# Patient Record
Sex: Male | Born: 1990 | Race: White | Hispanic: No | Marital: Married | Smoking: Never smoker
Health system: Southern US, Community
[De-identification: ages and names within clinical notes are randomized; demographics above are authoritative.]

## PROBLEM LIST (undated history)

## (undated) DIAGNOSIS — Z87442 Personal history of urinary calculi: Secondary | ICD-10-CM

## (undated) HISTORY — PX: OTHER SURGICAL HISTORY: SHX169

---
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
## 2011-05-14 ENCOUNTER — Emergency Department (HOSPITAL_COMMUNITY): Payer: Medicaid Other

## 2011-05-14 ENCOUNTER — Emergency Department (HOSPITAL_COMMUNITY)
Admission: EM | Admit: 2011-05-14 | Discharge: 2011-05-14 | Disposition: A | Discharge status: Home / Self Care | Payer: Medicaid Other | Attending: Emergency Medicine | Admitting: Emergency Medicine

## 2011-05-14 ENCOUNTER — Encounter (HOSPITAL_COMMUNITY): Payer: Self-pay

## 2011-05-14 DIAGNOSIS — R109 Unspecified abdominal pain: Secondary | ICD-10-CM | POA: Insufficient documentation

## 2011-05-14 DIAGNOSIS — R112 Nausea with vomiting, unspecified: Secondary | ICD-10-CM

## 2011-05-14 DIAGNOSIS — R197 Diarrhea, unspecified: Secondary | ICD-10-CM | POA: Insufficient documentation

## 2011-05-14 LAB — LIPASE, BLOOD: Lipase: 16 U/L (ref 11–59)

## 2011-05-14 LAB — DIFFERENTIAL
Basophils Absolute: 0 10*3/uL (ref 0.0–0.1)
Eosinophils Absolute: 0 10*3/uL (ref 0.0–0.7)
Lymphs Abs: 1.6 10*3/uL (ref 0.7–4.0)

## 2011-05-14 LAB — CBC
HCT: 51 % (ref 39.0–52.0)
Hemoglobin: 18 g/dL — ABNORMAL HIGH (ref 13.0–17.0)
MCHC: 35.3 g/dL (ref 30.0–36.0)
MCV: 89.2 fL (ref 78.0–100.0)
Platelets: 289 10*3/uL (ref 150–400)
RBC: 5.72 MIL/uL (ref 4.22–5.81)

## 2011-05-14 LAB — COMPREHENSIVE METABOLIC PANEL
AST: 20 U/L (ref 0–37)
Alkaline Phosphatase: 100 U/L (ref 39–117)
BUN: 20 mg/dL (ref 6–23)
CO2: 25 mEq/L (ref 19–32)
Calcium: 11.1 mg/dL — ABNORMAL HIGH (ref 8.4–10.5)
Chloride: 99 mEq/L (ref 96–112)
Creatinine, Ser: 0.96 mg/dL (ref 0.50–1.35)
Glucose, Bld: 162 mg/dL — ABNORMAL HIGH (ref 70–99)

## 2011-05-14 MED ORDER — ONDANSETRON HCL 4 MG PO TABS: 4.0000 mg | ORAL_TABLET | Freq: Four times a day (QID) | ORAL | Status: AC | PRN

## 2011-05-14 MED ORDER — SODIUM CHLORIDE 0.9 % IV BOLUS (SEPSIS)
1000.0000 mL | Freq: Once | INTRAVENOUS | Status: AC
  Administered 2011-05-14: 1000 mL via INTRAVENOUS

## 2011-05-14 MED ORDER — ONDANSETRON HCL 4 MG/2ML IJ SOLN
4.0000 mg | Freq: Once | INTRAMUSCULAR | Status: AC
  Administered 2011-05-14: 4 mg via INTRAVENOUS
  Filled 2011-05-14: qty 2

## 2011-05-14 MED ORDER — IOHEXOL 300 MG/ML  SOLN
100.0000 mL | Freq: Once | INTRAMUSCULAR | Status: AC | PRN
  Administered 2011-05-14: 100 mL via INTRAVENOUS

## 2011-05-14 MED ORDER — ONDANSETRON HCL 4 MG/2ML IJ SOLN
INTRAMUSCULAR | Status: AC
  Filled 2011-05-14: qty 2

## 2011-05-14 MED ORDER — FAMOTIDINE IN NACL 20-0.9 MG/50ML-% IV SOLN
20.0000 mg | Freq: Once | INTRAVENOUS | Status: AC
  Administered 2011-05-14: 20 mg via INTRAVENOUS
  Filled 2011-05-14: qty 50

## 2011-05-14 MED ORDER — SODIUM CHLORIDE 0.9 % IV SOLN
INTRAVENOUS | Status: DC
  Administered 2011-05-14: 14:00:00 via INTRAVENOUS

## 2011-05-14 MED ORDER — ONDANSETRON HCL 4 MG/2ML IJ SOLN
4.0000 mg | Freq: Once | INTRAMUSCULAR | Status: AC
  Administered 2011-05-14: 4 mg via INTRAVENOUS

## 2011-05-14 MED ORDER — ONDANSETRON HCL 4 MG/2ML IJ SOLN: 4.0000 mg | INTRAMUSCULAR | Status: DC | PRN

## 2011-05-14 MED ORDER — ONDANSETRON HCL 4 MG/2ML IJ SOLN
4.0000 mg | INTRAMUSCULAR | Status: AC | PRN
  Administered 2011-05-14 (×2): 4 mg via INTRAVENOUS
  Filled 2011-05-14 (×2): qty 2

## 2011-05-14 MED ORDER — TRAMADOL HCL 50 MG PO TABS: 50.0000 mg | ORAL_TABLET | Freq: Four times a day (QID) | ORAL | Status: AC | PRN

## 2011-05-14 MED ORDER — FENTANYL CITRATE 0.05 MG/ML IJ SOLN
50.0000 ug | Freq: Once | INTRAMUSCULAR | Status: AC
  Administered 2011-05-14: 50 ug via INTRAVENOUS
  Filled 2011-05-14: qty 2

## 2011-05-14 NOTE — ED Notes (Signed)
MD at bedside. 

## 2011-05-14 NOTE — ED Provider Notes (Signed)
History     CSN: [id number]  Arrival date & time 05/14/11  1154   First MD Initiated Contact with Patient 05/14/11 1311      Chief Complaint  Patient presents with  . Emesis  . Abdominal Pain    HPI Pt was seen at 1320.  Per pt, c/o gradual onset and persistence of constant upper abd "pain" that began this morning approx 0500 PTA.  Has been associated with multiple intermittent episodes of N/V/D.  Describes the abd pain as "cramping."  Denies fevers, no CP/SOB, no back pain, no black or blood in stools or emesis.     History reviewed. No pertinent past medical history.  History reviewed. No pertinent past surgical history.  History  Substance Use Topics  . Smoking status: Never Smoker   . Smokeless tobacco: Not on file  . Alcohol Use: No    Review of Systems ROS: Statement: All systems negative except as marked or noted in the HPI; Constitutional: Negative for fever and chills. ; ; Eyes: Negative for eye pain, redness and discharge. ; ; ENMT: Negative for ear pain, hoarseness, nasal congestion, sinus pressure and sore throat. ; ; Cardiovascular: Negative for chest pain, palpitations, diaphoresis, dyspnea and peripheral edema. ; ; Respiratory: Negative for cough, wheezing and stridor. ; ; Gastrointestinal: +abd pain, N/V/D. Negative for blood in stool, hematemesis, jaundice and rectal bleeding. . ; ; Genitourinary: Negative for dysuria, flank pain and hematuria. ; ; Musculoskeletal: Negative for back pain and neck pain. Negative for swelling and trauma.; ; Skin: Negative for pruritus, rash, abrasions, blisters, bruising and skin lesion.; ; Neuro: Negative for headache, lightheadedness and neck stiffness. Negative for weakness, altered level of consciousness , altered mental status, extremity weakness, paresthesias, involuntary movement, seizure and syncope.     Allergies  Amoxicillin  Home Medications  No current outpatient prescriptions on file.  BP 115/78  Pulse 99  Temp(Src)  97.2 F (36.2 C) (Oral)  Resp 18  Ht 6\' 1"  (1.854 m)  Wt 145 lb (65.772 kg)  BMI 19.13 kg/m2  SpO2 100%  Physical Exam 1325: Physical examination:  Nursing notes reviewed; Vital signs and O2 SAT reviewed;  Constitutional: Well developed, Well nourished, Well hydrated, In no acute distress; Head:  Normocephalic, atraumatic; Eyes: EOMI, PERRL, No scleral icterus; ENMT: Mouth and pharynx normal, Mucous membranes moist; Neck: Supple, Full range of motion, No lymphadenopathy; Cardiovascular: Regular rate and rhythm, No murmur, rub, or gallop; Respiratory: Breath sounds clear & equal bilaterally, No rales, rhonchi, wheezes, or rub, Normal respiratory effort/excursion; Chest: Nontender, Movement normal; Abdomen: Soft, +mild diffuse tenderness to palp, no rebound or guarding. Nondistended, Normal bowel sounds; Extremities: Pulses normal, No tenderness, No edema, No calf edema or asymmetry.; Neuro: AA&Ox3, Major CN grossly intact.  No gross focal motor or sensory deficits in extremities.; Skin: Color normal, Warm, Dry, no rash.    ED Course  Procedures    MDM  MDM Reviewed: nursing note and vitals Interpretation: labs and CT scan     Results for orders placed during the hospital encounter of 05/14/11  CBC      Component Value Range   WBC 24.7 (*) 4.0 - 10.5 (K/uL)   RBC 5.72  4.22 - 5.81 (MIL/uL)   Hemoglobin 18.0 (*) 13.0 - 17.0 (g/dL)   HCT 78.2  95.6 - 21.3 (%)   MCV 89.2  78.0 - 100.0 (fL)   MCH 31.5  26.0 - 34.0 (pg)   MCHC 35.3  30.0 - 36.0 (g/dL)  RDW 13.4  11.5 - 15.5 (%)   Platelets 289  150 - 400 (K/uL)  DIFFERENTIAL      Component Value Range   Neutrophils Relative 83 (*) 43 - 77 (%)   Neutro Abs 20.5 (*) 1.7 - 7.7 (K/uL)   Lymphocytes Relative 6 (*) 12 - 46 (%)   Lymphs Abs 1.6  0.7 - 4.0 (K/uL)   Monocytes Relative 10  3 - 12 (%)   Monocytes Absolute 2.6 (*) 0.1 - 1.0 (K/uL)   Eosinophils Relative 0  0 - 5 (%)   Eosinophils Absolute 0.0  0.0 - 0.7 (K/uL)   Basophils  Relative 0  0 - 1 (%)   Basophils Absolute 0.0  0.0 - 0.1 (K/uL)  COMPREHENSIVE METABOLIC PANEL      Component Value Range   Sodium 141  135 - 145 (mEq/L)   Potassium 3.7  3.5 - 5.1 (mEq/L)   Chloride 99  96 - 112 (mEq/L)   CO2 25  19 - 32 (mEq/L)   Glucose, Bld 162 (*) 70 - 99 (mg/dL)   BUN 20  6 - 23 (mg/dL)   Creatinine, Ser 1.19  0.50 - 1.35 (mg/dL)   Calcium 14.7 (*) 8.4 - 10.5 (mg/dL)   Total Protein 8.6 (*) 6.0 - 8.3 (g/dL)   Albumin 5.4 (*) 3.5 - 5.2 (g/dL)   AST 20  0 - 37 (U/L)   ALT 13  0 - 53 (U/L)   Alkaline Phosphatase 100  39 - 117 (U/L)   Total Bilirubin 0.6  0.3 - 1.2 (mg/dL)   GFR calc non Af Amer >90  >90 (mL/min)   GFR calc Af Amer >90  >90 (mL/min)  LIPASE, BLOOD      Component Value Range   Lipase 16  11 - 59 (U/L)    Ct Abdomen Pelvis W Contrast 05/14/2011  *RADIOLOGY REPORT*  Clinical Data: Abdominal pain with nausea and vomiting.  CT ABDOMEN AND PELVIS WITH CONTRAST  Technique:  Multidetector CT imaging of the abdomen and pelvis was performed following the standard protocol during bolus administration of intravenous contrast.  Contrast: OMNIPAQUE IOHEXOL 300 MG/ML  SOLN  Comparison: Radiography 01/30/2010  Findings: Lung bases are clear.  No pleural or pericardial fluid. The liver has a normal appearance without focal lesions or biliary ductal dilatation.  No calcified gallstones.  The spleen is normal. The pancreas is normal.  The adrenal glands are normal.  The kidneys are normal.  The aorta and IVC are normal.  No retroperitoneal mass or adenopathy.  No free intraperitoneal fluid or air.  The appendix is normal.  No other bowel pathology is evident. Bladder, prostate gland and seminal vesicles are unremarkable.  No significant bony finding.  IMPRESSION: Normal CT scan of the abdomen and pelvis.  Original Report Authenticated By: Thomasenia Sales, M.D.     5:39 PM:   Pt has tol PO after IVF and IV zofran.   WBC likely due to demargination due to N/V, as CT  scan of A/P and other labs unremarkable, VS are stable without fever.  Wants to go home now.  Dx testing d/w pt and family.  Questions answered.  Verb understanding, agreeable to d/c home with outpt f/u.         Laray Anger, DO 05/16/11 2329

## 2011-05-14 NOTE — ED Notes (Signed)
Patient actively vomiting, c/o abd pain.

## 2011-05-14 NOTE — Discharge Instructions (Signed)
RESOURCE GUIDE  Dental Problems  Patients with Medicaid: [facility] [phone] [address]                                           [phone] Phone:  [phone]                                                  Phone:  [phone]  If unable to pay or uninsured, contact:  [facility] or [facility] to become qualified for the adult dental clinic.  Chronic Pain Problems Contact [facility]  [phone] Patients need to be referred by their primary care doctor.  Insufficient Money for Medicine Contact United Way:  call "211" or [facility] 847-520-0509.  No Primary Care Doctor Call [facility]  [phone] Other agencies that provide inexpensive medical care    [facility]  [phone]    [facility]  [phone]    [facility]  [phone]    [facility]  [phone]    [facility]  [phone]    [facility]  [phone]  [facility]  [phone] [facility]  [phone] [facility]   [phone] (emergency services [phone])  Substance Abuse Resources [facility]  [phone] [facility] [phone] [facility] [place] [phone] Floydene Flock [phone] Residential & Outpatient Substance Abuse Program  [phone]  Abuse/Neglect [facility] Child Abuse Hotline (253)132-4499 [facility] Child Abuse Hotline (705)131-7672 (After Hours)  Emergency Shelter [facility] [phone]  Maternity Homes [facility] at the [place] [facility] [place] [phone] [facility] Alert Services [phone]  MRSA Hotline #:   (781)463-4733    [facility] Resources  [facility]     [employer]                          [facility] Dept. [address] [place]                       [address]      [address] [place] [address]  Blondell Reveal Phone:  [phone]                                   Phone:  [phone]                 Phone:  [phone]  [facility] Phone:  [phone]  [facility] Child Abuse Hotline 731-199-4959 [phone] (After Hours)    Take the prescription as directed.  Increase your fluid intake (ie:  Gatoraide) for the next few days, as discussed.  Eat a bland diet and advance to your regular diet slowly as you can tolerate it.   Avoid full strength juices, as well as milk and milk products until your diarrhea has resolved.   Call your regular medical doctor on Monday to schedule a follow up appointment within the next week.  Return to the Emergency Department immediately if not improving (or even worsening) despite taking the medicines as prescribed, any black or bloody stool or vomit, if you develop a fever greater than "101," or for any other concerns.

## 2011-05-14 NOTE — ED Notes (Signed)
Pt presents with abdominal pain and vomiting since 0500.

## 2011-05-14 NOTE — ED Notes (Signed)
In to discharge pt. Pt states he has to have pain meds to take at home or he will be right back up here. edp aware

## 2012-06-30 ENCOUNTER — Emergency Department (HOSPITAL_COMMUNITY)
Admission: EM | Admit: 2012-06-30 | Discharge: 2012-06-30 | Disposition: A | Discharge status: Home / Self Care | Payer: Medicaid Other | Attending: Emergency Medicine | Admitting: Emergency Medicine

## 2012-06-30 ENCOUNTER — Encounter (HOSPITAL_COMMUNITY): Payer: Self-pay | Admitting: *Deleted

## 2012-06-30 ENCOUNTER — Ambulatory Visit: Payer: Self-pay | Admitting: Physician Assistant

## 2012-06-30 DIAGNOSIS — Z880 Allergy status to penicillin: Secondary | ICD-10-CM | POA: Insufficient documentation

## 2012-06-30 DIAGNOSIS — R1110 Vomiting, unspecified: Secondary | ICD-10-CM | POA: Insufficient documentation

## 2012-06-30 DIAGNOSIS — R1032 Left lower quadrant pain: Secondary | ICD-10-CM | POA: Insufficient documentation

## 2012-06-30 DIAGNOSIS — R109 Unspecified abdominal pain: Secondary | ICD-10-CM

## 2012-06-30 LAB — COMPREHENSIVE METABOLIC PANEL
Albumin: 4.3 g/dL (ref 3.5–5.2)
BUN: 10 mg/dL (ref 6–23)
Calcium: 9.7 mg/dL (ref 8.4–10.5)
Chloride: 99 mEq/L (ref 96–112)
GFR calc Af Amer: >90 mL/min (ref >90)
Glucose, Bld: 99 mg/dL (ref 70–99)

## 2012-06-30 LAB — CBC WITH DIFFERENTIAL/PLATELET
Basophils Relative: 0 % (ref 0–1)
Eosinophils Absolute: 0.2 10*3/uL (ref 0.0–0.7)
HCT: 44.7 % (ref 39.0–52.0)
Hemoglobin: 15.7 g/dL (ref 13.0–17.0)
Lymphocytes Relative: 31 % (ref 12–46)
Monocytes Relative: 10 % (ref 3–12)
Neutro Abs: 3.2 10*3/uL (ref 1.7–7.7)
Neutrophils Relative %: 56 % (ref 43–77)
Platelets: 184 10*3/uL (ref 150–400)
WBC: 5.8 10*3/uL (ref 4.0–10.5)

## 2012-06-30 LAB — URINALYSIS, ROUTINE W REFLEX MICROSCOPIC
Bilirubin Urine: NEGATIVE
Hgb urine dipstick: NEGATIVE
Protein, ur: NEGATIVE mg/dL
Specific Gravity, Urine: 1.01 (ref 1.005–1.030)
pH: 6.5 (ref 5.0–8.0)

## 2012-06-30 LAB — LIPASE, BLOOD: Lipase: 17 U/L (ref 11–59)

## 2012-06-30 MED ORDER — HYDROCODONE-ACETAMINOPHEN 5-325 MG PO TABS
1.0000 | ORAL_TABLET | Freq: Once | ORAL | Status: AC
  Administered 2012-06-30: 1 via ORAL
  Filled 2012-06-30: qty 1

## 2012-06-30 MED ORDER — DICYCLOMINE HCL 20 MG PO TABS: ORAL_TABLET | ORAL | Status: DC

## 2012-06-30 MED ORDER — OXYCODONE-ACETAMINOPHEN 5-325 MG PO TABS: 1.0000 | ORAL_TABLET | Freq: Four times a day (QID) | ORAL | Status: DC | PRN

## 2012-06-30 NOTE — ED Notes (Signed)
Abdominal pain x 1 year. Pain became worse a month ago. Also states right hand fx and "Morehead didn't wrap it right". Does not know who follow up orthopedist is to follow up with.

## 2012-06-30 NOTE — ED Provider Notes (Signed)
History  This chart was scribed for Tony Lennert, MD by Ardelia Mems, ED Scribe. This patient was seen in room [facility]10 and the patient's care was started at 12:18 PM.   CSN: [id number]  Arrival date & time 06/30/12  1124      Chief Complaint  Patient presents with  . Hand Pain  . Abdominal Pain     Patient is a 22 y.o. male presenting with hand pain and abdominal pain. The history is provided by the patient. No language interpreter was used.  Hand Pain This is a new problem. The problem occurs constantly. The problem has not changed since onset.Associated symptoms include abdominal pain. Pertinent negatives include no chest pain and no headaches. Exacerbated by: Placement of splint. Nothing relieves the symptoms.  Abdominal Pain This is a chronic problem. Episode onset: 1 year ago. The problem has not changed since onset.Associated symptoms include abdominal pain. Pertinent negatives include no chest pain and no headaches. Nothing aggravates the symptoms.    HPI Comments: Tony Turner is a 22 y.o. male who presents to the Emergency Department complaining of sharp abdominal pain of 1 year duration that has gradually worsened over the last several months. There is associated emesis. Last episode of emesis was 2 days ago. Pt also states that his right hand is fractured and that "Morehead" didn't splint it correctly. Pt denies fever, chill, diarrhea, recent injury or any other symptoms.   History reviewed. No pertinent past medical history.  History reviewed. No pertinent past surgical history.  No family history on file.  History  Substance Use Topics  . Smoking status: Never Smoker   . Smokeless tobacco: Not on file  . Alcohol Use: No      Review of Systems  Constitutional: Negative for appetite change and fatigue.  HENT: Negative for congestion, sinus pressure and ear discharge.   Eyes: Negative for discharge.  Respiratory: Negative for cough.    Cardiovascular: Negative for chest pain.  Gastrointestinal: Positive for vomiting and abdominal pain. Negative for diarrhea.  Genitourinary: Negative for frequency and hematuria.  Musculoskeletal: Negative for back pain.  Skin: Negative for rash.  Neurological: Negative for seizures and headaches.  Psychiatric/Behavioral: Negative for hallucinations.    Allergies  Amoxicillin  Home Medications  No current outpatient prescriptions on file.  Triage Vitals: BP 113/85  Pulse 83  Temp(Src) 97.6 F (36.4 C) (Oral)  Resp 16  Ht 6\' 1"  (1.854 m)  Wt 155 lb (70.308 kg)  BMI 20.45 kg/m2  SpO2 100%  Physical Exam  Constitutional: He is oriented to person, place, and time. He appears well-developed.  HENT:  Head: Normocephalic.  Eyes: Conjunctivae and EOM are normal. No scleral icterus.  Neck: Neck supple. No thyromegaly present.  Cardiovascular: Normal rate and regular rhythm.  Exam reveals no gallop and no friction rub.   No murmur heard. Pulmonary/Chest: No stridor. He has no wheezes. He has no rales. He exhibits no tenderness.  Abdominal: He exhibits no distension. There is tenderness. There is no rebound.  Mild LLQ tenderness.  Musculoskeletal: Normal range of motion. He exhibits no edema.  Splint on right hand for boxer's fracture.  Lymphadenopathy:    He has no cervical adenopathy.  Neurological: He is oriented to person, place, and time. Coordination normal.  Skin: No rash noted. No erythema.  Psychiatric: He has a normal mood and affect. His behavior is normal.    ED Course  Procedures (including critical care time)  DIAGNOSTIC STUDIES: Oxygen Saturation  is 100% on RA, normal by my interpretation.    COORDINATION OF CARE: 12:21 PM- Pt advised of plan for treatment and pt agrees.  Medications  HYDROcodone-acetaminophen (NORCO/VICODIN) 5-325 MG per tablet 1 tablet (1 tablet Oral Given 06/30/12 1303)      Labs Reviewed  CBC WITH DIFFERENTIAL  COMPREHENSIVE  METABOLIC PANEL  LIPASE, BLOOD  URINALYSIS, ROUTINE W REFLEX MICROSCOPIC   No results found.   No diagnosis found.    MDM         The chart was scribed for me under my direct supervision.  I personally performed the history, physical, and medical decision making and all procedures in the evaluation of this patient.Tony Lennert, MD 06/30/12 [phone]

## 2012-07-06 ENCOUNTER — Ambulatory Visit (INDEPENDENT_AMBULATORY_CARE_PROVIDER_SITE_OTHER): Payer: Medicaid Other | Admitting: Physician Assistant

## 2012-07-06 ENCOUNTER — Other Ambulatory Visit: Payer: Self-pay

## 2012-07-06 ENCOUNTER — Encounter: Payer: Self-pay | Admitting: Physician Assistant

## 2012-07-06 VITALS — BP 135/83 | HR 65 | Temp 97.9°F | Ht 73.0 in | Wt 165.0 lb

## 2012-07-06 DIAGNOSIS — R1032 Left lower quadrant pain: Secondary | ICD-10-CM

## 2012-07-06 DIAGNOSIS — S62309A Unspecified fracture of unspecified metacarpal bone, initial encounter for closed fracture: Secondary | ICD-10-CM

## 2012-07-06 NOTE — Progress Notes (Signed)
Subjective:     Patient ID: Tony Turner, male   DOB: 09/15/1990, 22 y.o.   MRN: [id number]  HPI Pt recently seen at [facility] ER for 5th metacarpal fx Told to f/u here for referral Pt then went to [facility] for the same several days later He was also seen due to abd pain x 1 yr Informed all labs nl by the ER Sx are intermit but occur everyday Admits to constipation on occasion Denies N/V or weight loss  Review of Systems  All other systems reviewed and are negative.       Objective:   Physical Exam  Nursing note and vitals reviewed.  R arm in sling with arm in splint Abd- soft, + TTP LLQ, no masses/HSM No CVAT    Assessment:     1. Metacarpal bone fracture, closed, initial encounter   2. LLQ abdominal pain        Plan:  Refer to Ortho in [place] per pt request ? Constipation for intermit abd pain Milk of Mag now OTC fiber daily Pt request for pain med denied today

## 2012-07-06 NOTE — Patient Instructions (Signed)
Boxer's Fracture °You have a break (fracture) of the fifth metacarpal bone. This is commonly called a boxer's fracture. This is the bone in the hand where the little finger attaches. The fracture is in the end of that bone, closest to the little finger. It is usually caused when you hit an object with a clenched fist. Often, the knuckle is pushed down by the impact. Sometimes, the fracture rotates out of position. A boxer's fracture will usually heal within 6 weeks, if it is treated properly and protected from re-injury. Surgery is sometimes needed. °A cast, splint, or bulky hand dressing may be used to protect and immobilize a boxer's fracture. Do not remove this device or dressing until your caregiver approves. Keep your hand elevated, and apply ice packs for 15-20 minutes every 2 hours, for the first 2 days. Elevation and ice help reduce swelling and relieve pain. See your caregiver, or an orthopedic specialist, for follow-up care within the next 10 days. This is to make sure your fracture is healing properly. °Document Released: 01/25/2005 Document Revised: 04/19/2011 Document Reviewed: 07/15/2006 °ExitCare® Patient Information ©2014 [employer]. ° °

## 2012-07-12 ENCOUNTER — Telehealth: Payer: Self-pay | Admitting: *Deleted

## 2012-07-12 ENCOUNTER — Encounter (HOSPITAL_COMMUNITY): Payer: Self-pay | Admitting: Emergency Medicine

## 2012-07-12 ENCOUNTER — Emergency Department (HOSPITAL_COMMUNITY): Payer: Medicaid Other

## 2012-07-12 ENCOUNTER — Emergency Department (HOSPITAL_COMMUNITY)
Admission: EM | Admit: 2012-07-12 | Discharge: 2012-07-12 | Disposition: A | Discharge status: Home / Self Care | Payer: Medicaid Other | Attending: Emergency Medicine | Admitting: Emergency Medicine

## 2012-07-12 DIAGNOSIS — Y929 Unspecified place or not applicable: Secondary | ICD-10-CM | POA: Insufficient documentation

## 2012-07-12 DIAGNOSIS — IMO0002 Reserved for concepts with insufficient information to code with codable children: Secondary | ICD-10-CM | POA: Insufficient documentation

## 2012-07-12 DIAGNOSIS — S62339A Displaced fracture of neck of unspecified metacarpal bone, initial encounter for closed fracture: Secondary | ICD-10-CM | POA: Insufficient documentation

## 2012-07-12 DIAGNOSIS — Y939 Activity, unspecified: Secondary | ICD-10-CM | POA: Insufficient documentation

## 2012-07-12 DIAGNOSIS — S62339S Displaced fracture of neck of unspecified metacarpal bone, sequela: Secondary | ICD-10-CM

## 2012-07-12 MED ORDER — HYDROCODONE-ACETAMINOPHEN 5-325 MG PO TABS: 1.0000 | ORAL_TABLET | Freq: Once | ORAL | Status: DC

## 2012-07-12 MED ORDER — OXYCODONE-ACETAMINOPHEN 5-325 MG PO TABS
1.0000 | ORAL_TABLET | Freq: Once | ORAL | Status: AC
  Administered 2012-07-12: 1 via ORAL
  Filled 2012-07-12: qty 1

## 2012-07-12 MED ORDER — OXYCODONE-ACETAMINOPHEN 5-325 MG PO TABS: 1.0000 | ORAL_TABLET | ORAL | Status: DC | PRN

## 2012-07-12 NOTE — ED Notes (Signed)
See PA's assessment. 

## 2012-07-12 NOTE — Telephone Encounter (Signed)
Would like referral to GI for constipation.  This was briefly discussed at visit on 5/29 but was not the focus of the appointment. Pt was seen in ER today and told that he needed a referral.  Appt scheduled for tomorrow afternoon to evaluate and treat.

## 2012-07-12 NOTE — ED Notes (Signed)
Pt has bower fracture to right hand. Pt right hand/forearm in cast from [facility] x 4 days ago. Pt c/o pain to hand and "feeling like his bone is grinding". Pt also c/o intermittent abd pain with n/v x 1 year.

## 2012-07-12 NOTE — ED Provider Notes (Signed)
History     CSN: [id number]  Arrival date & time 07/12/12  1048   First MD Initiated Contact with Patient 07/12/12 1056      Chief Complaint  Patient presents with  . Arm Injury  . Abdominal Pain    (Consider location/radiation/quality/duration/timing/severity/associated sxs/prior treatment) HPI Comments: Tony Turner is a 22 y.o. Male presenting with increased pain at the site of his right boxers fracture which he incurred on 06/30/12.  He was seen here for this injury at which time he was placed in a splint and has followed up with [facility] at which time they replaced his splint with an ulner gutter.  He reports hitting the edge of the splint and felt his "whole spint buckle and  move" and now feels like his fracture is no longer aligned with constant pain and a "grinding sensation".  He is also out of his pain medicine and has been taking ibuprofen which is causing stomach upset but not relieving his pain.  He does have a history of chronic abdominal pain for the past year and was worked up for this at his last visit 5/23.  He denies any new or worsened symptoms.  Has had no nausea, vomiting,  Denies fevers.  He denies numbness in his fingertips.  Pain is worse with movement and nothing relieves his pain.     The history is provided by the patient and the spouse.    History reviewed. No pertinent past medical history.  History reviewed. No pertinent past surgical history.  Family History  Problem Relation Age of Onset  . Arthritis Mother     History  Substance Use Topics  . Smoking status: Never Smoker   . Smokeless tobacco: Never Used  . Alcohol Use: No      Review of Systems  Constitutional: Negative for fever and chills.  Gastrointestinal: Negative for nausea, vomiting and diarrhea.  Musculoskeletal: Positive for arthralgias. Negative for myalgias and joint swelling.  Neurological: Negative for weakness and numbness.    Allergies   Amoxicillin  Home Medications   Current Outpatient Rx  Name  Route  Sig  Dispense  Refill  . dicyclomine (BENTYL) 20 MG tablet      One po every 8 hours for abd cramps   20 tablet   0   . HYDROcodone-acetaminophen (NORCO/VICODIN) 5-325 MG per tablet   Oral   Take 1 tablet by mouth every 6 (six) hours as needed for pain.         Marland Kitchen oxyCODONE-acetaminophen (PERCOCET/ROXICET) 5-325 MG per tablet   Oral   Take 1 tablet by mouth every 6 (six) hours as needed for pain.   30 tablet   0     BP 122/75  Pulse 76  Temp(Src) 98 F (36.7 C) (Oral)  Resp 18  Ht 6\' 1"  (1.854 m)  Wt 165 lb (74.844 kg)  BMI 21.77 kg/m2  SpO2 97%  Physical Exam  Constitutional: He appears well-developed and well-nourished.  HENT:  Head: Atraumatic.  Neck: Normal range of motion.  Cardiovascular:  Pulses equal bilaterally  Musculoskeletal: He exhibits tenderness.  Neurological: He is alert. He has normal strength. He displays normal reflexes. No sensory deficit.  Equal strength  Skin: Skin is warm and dry.  Psychiatric: He has a normal mood and affect.    ED Course  Procedures (including critical care time)  Labs Reviewed - No data to display No results found.   No diagnosis found.    MDM  Patients labs and/or radiological studies were viewed and considered during the medical decision making and disposition process.  Pt's fracture definitely less aligned compared to original  Xray completed here.  Ulner gutter splint was replaced with new splint,  Gentle pressure applied over site of fracture for better alignment.  Pt still with complaint of increased pain at site,  Although cap refill less than 3 sec,  Distal sensation intact.    Discussed with Dr Blinda Leatherwood who also evaluated pt who replaced his splint with a longer splint extending beyond his finger tips.  Cap refill less than 3 seconds, distal sensation intact after this application.  He was encouraged to call his orthopedist at [facility]  ortho if sx persist, otherwise f/u with them on the 16th which is his next scheduled appt.  Oxycodone prescribed.  Encouraged ice,  Elevation.        Burgess Amor, PA-C 07/12/12 1703  Burgess Amor, PA-C 07/12/12 1705

## 2012-07-13 ENCOUNTER — Ambulatory Visit: Payer: Medicaid Other | Admitting: Physician Assistant

## 2012-07-14 ENCOUNTER — Emergency Department (HOSPITAL_COMMUNITY)
Admission: EM | Admit: 2012-07-14 | Discharge: 2012-07-14 | Disposition: A | Discharge status: Home / Self Care | Payer: Medicaid Other | Attending: Emergency Medicine | Admitting: Emergency Medicine

## 2012-07-14 ENCOUNTER — Encounter (HOSPITAL_COMMUNITY): Payer: Self-pay | Admitting: *Deleted

## 2012-07-14 DIAGNOSIS — Z880 Allergy status to penicillin: Secondary | ICD-10-CM | POA: Insufficient documentation

## 2012-07-14 DIAGNOSIS — S62309S Unspecified fracture of unspecified metacarpal bone, sequela: Secondary | ICD-10-CM

## 2012-07-14 DIAGNOSIS — M2550 Pain in unspecified joint: Secondary | ICD-10-CM | POA: Insufficient documentation

## 2012-07-14 DIAGNOSIS — M79641 Pain in right hand: Secondary | ICD-10-CM

## 2012-07-14 DIAGNOSIS — M79609 Pain in unspecified limb: Secondary | ICD-10-CM | POA: Insufficient documentation

## 2012-07-14 MED ORDER — KETOROLAC TROMETHAMINE 60 MG/2ML IM SOLN
30.0000 mg | Freq: Once | INTRAMUSCULAR | Status: AC
  Administered 2012-07-14: 30 mg via INTRAMUSCULAR
  Filled 2012-07-14: qty 2

## 2012-07-14 NOTE — ED Notes (Signed)
Pt has a hand injury from May 2014. Initial xray ordered by [facility]. Was seen at [facility] then a few days later at [facility]. States he was referred to a ortho specialist but does not remember his name. Has not been back to the specialist with this continued pain. States that [facility] told him to come here for a referral to a hand specialist.

## 2012-07-14 NOTE — ED Provider Notes (Signed)
History     CSN: [id number]  Arrival date & time 07/14/12  1136   First MD Initiated Contact with Patient 07/14/12 1147      Chief Complaint  Patient presents with  . Hand Pain    (Consider location/radiation/quality/duration/timing/severity/associated sxs/prior treatment) HPI  EIDAN MUELLNER is a 22 y.o. male complaining of pain exacerbation 2 right hand, patient had a boxer's fracture approximately 2 weeks ago. He has been seen multiple times for this. Denies numbness and paresthesia but states there is a significant pain exacerbation. He denies any new trauma to the area. Patient has an appointment at the orthopedist several hours from now. He called [facility] ED and they told him to come to the emergency room at [facility] we did call the orthopedist on call.  History reviewed. No pertinent past medical history.  History reviewed. No pertinent past surgical history.  Family History  Problem Relation Age of Onset  . Arthritis Mother     History  Substance Use Topics  . Smoking status: Never Smoker   . Smokeless tobacco: Never Used  . Alcohol Use: No      Review of Systems  Constitutional: Negative for fever.  Respiratory: Negative for shortness of breath.   Cardiovascular: Negative for chest pain.  Gastrointestinal: Negative for nausea, vomiting, abdominal pain and diarrhea.  Musculoskeletal: Positive for arthralgias.  All other systems reviewed and are negative.    Allergies  Penicillins and Amoxicillin  Home Medications   Current Outpatient Rx  Name  Route  Sig  Dispense  Refill  . dicyclomine (BENTYL) 20 MG tablet      One po every 8 hours for abd cramps   20 tablet   0   . oxyCODONE-acetaminophen (PERCOCET/ROXICET) 5-325 MG per tablet   Oral   Take 1 tablet by mouth every 6 (six) hours as needed for pain.   30 tablet   0     BP 114/71  Pulse 87  Temp(Src) 97.9 F (36.6 C) (Oral)  Resp 16  SpO2 97%  Physical Exam  Nursing note and vitals  reviewed. Constitutional: He is oriented to person, place, and time. He appears well-developed and well-nourished. No distress.  HENT:  Head: Normocephalic.  Eyes: Conjunctivae and EOM are normal.  Cardiovascular: Normal rate.   Pulmonary/Chest: Effort normal and breath sounds normal. No stridor.  Musculoskeletal: Normal range of motion.  Cap refill is less than 2 seconds x5 digits, patient is able to wiggle tips of fingers in the splint. Gross sensation intact.  Neurological: He is alert and oriented to person, place, and time.  Psychiatric: He has a normal mood and affect.    ED Course  Procedures (including critical care time)  Labs Reviewed - No data to display No results found.   1. Right hand pain   2. Boxers fracture, sequela       MDM   Filed Vitals:   07/14/12 1141 07/14/12 1147  BP:  114/71  Pulse: 87   Temp: 97.9 F (36.6 C)   TempSrc: Oral   Resp: 16   SpO2: 97%      LEANDRO BERKOWITZ is a 22 y.o. male with pain exacerbation to boxer's fracture. Patient was seen 2 days ago, x-ray showed slight increase in the angulation and patient was resplinted. Patient is neurovascularly intact and he has an appointment with the orthopedist in several hours.  Discussed case with attending who agrees with plan and stability to d/c to home.   Medications  ketorolac (TORADOL) injection 30 mg (30 mg Intramuscular Given 07/14/12 1228)    The patient is hemodynamically stable, appropriate for, and amenable to, discharge at this time. Pt verbalized understanding and agrees with care plan. Outpatient follow-up and return precautions given.    Discharge Medication List as of 07/14/2012 12:24 PM             Wynetta Emery, PA-C 07/14/12 1633

## 2012-07-14 NOTE — ED Notes (Signed)
Pt reports right boxers fx two weeks ago. Has been seen at [facility] for same. Has splint on pta. Reports still having severe pain to hand and int numbness. Able to move all digits.

## 2012-07-15 NOTE — ED Provider Notes (Signed)
Medical screening examination/treatment/procedure(s) were performed by non-physician practitioner and as supervising physician I was immediately available for consultation/collaboration.    Tenzin Pavon D Laurieann Friddle, MD 07/15/12 0925 

## 2012-07-15 NOTE — ED Provider Notes (Signed)
Medical screening examination/treatment/procedure(s) were performed by non-physician practitioner and as supervising physician I was immediately available for consultation/collaboration.    Ori Kreiter J. Pratt Bress, MD 07/15/12 1553 

## 2012-08-17 ENCOUNTER — Ambulatory Visit: Payer: Medicaid Other | Admitting: Occupational Therapy

## 2012-08-23 ENCOUNTER — Ambulatory Visit (HOSPITAL_COMMUNITY)
Admission: RE | Admit: 2012-08-23 | Discharge: 2012-08-23 | Disposition: A | Discharge status: Home / Self Care | Payer: Medicaid Other | Source: Ambulatory Visit | Attending: Orthopedic Surgery | Admitting: Orthopedic Surgery

## 2012-08-23 DIAGNOSIS — M6281 Muscle weakness (generalized): Secondary | ICD-10-CM | POA: Insufficient documentation

## 2012-08-23 DIAGNOSIS — IMO0001 Reserved for inherently not codable concepts without codable children: Secondary | ICD-10-CM | POA: Insufficient documentation

## 2012-08-23 DIAGNOSIS — R279 Unspecified lack of coordination: Secondary | ICD-10-CM | POA: Insufficient documentation

## 2012-08-23 DIAGNOSIS — S62339A Displaced fracture of neck of unspecified metacarpal bone, initial encounter for closed fracture: Secondary | ICD-10-CM | POA: Insufficient documentation

## 2012-08-23 DIAGNOSIS — M25529 Pain in unspecified elbow: Secondary | ICD-10-CM | POA: Insufficient documentation

## 2012-08-23 DIAGNOSIS — R29898 Other symptoms and signs involving the musculoskeletal system: Secondary | ICD-10-CM | POA: Insufficient documentation

## 2012-08-23 DIAGNOSIS — M25549 Pain in joints of unspecified hand: Secondary | ICD-10-CM | POA: Insufficient documentation

## 2012-08-23 NOTE — Evaluation (Signed)
Occupational Therapy Evaluation  Patient Details  Name: Tony Turner MRN: [id number] Date of Birth: 06-19-1990  Today's Date: 08/23/2012 Time: 1100-1145 OT Time Calculation (min): 45 min OT eval 1100-1120 20' MFR 4098-1191 15' Therex 4782-9562 10'  Visit#: 1 of 8  Re-eval: 09/20/12  Assessment Diagnosis: s/p Right boxers fx Next MD Visit: 09/05/12 Melvyn Novas  Authorization: Medicaid - waiting for approval  Authorization Time Period:    Authorization Visit#: 1 of     Past Medical History: No past medical history on file. Past Surgical History: No past surgical history on file.  Subjective Symptoms/Limitations Symptoms: S: When the last person was fitting me for my brace they pulled on my pinky and I felt it pop and ever since then it has hurt.  Pertinent History: Aylan experienced a right hand boxers fx approx May 2014 when he punched a fridge at home. Patient unwent surgery and was in a cast for 8 weeks. Patient received a ulnar gutter splint from [facility] which he wears at night or when he goes out to protect it. Dr. Melvyn Novas has referred patient to occupational therapy for evaluation and treatment. Patient Stated Goals: To get rid of the pain and be able to use his right hand with all activities.  Pain Assessment Currently in Pain?: Yes (with movement) Pain Score: 5  Pain Location: Finger (Comment which one) (Right pinky) Pain Orientation: Right  Precautions/Restrictions  Precautions Precautions: None (progress as tolerated)  Balance Screening Balance Screen Has the patient fallen in the past 6 months: No  Prior Functioning  Home Living Family/patient expects to be discharged to:: Private residence Living Arrangements: Spouse/significant other (girlfriend) Prior Function Level of Independence: Independent with basic ADLs;Independent with gait  Able to Take Stairs?: Yes Driving: No Vocation: Unemployed  Assessment ADL/Vision/Perception Dominant Hand:  Right Vision - History Baseline Vision: No visual deficits  Cognition/Observation Cognition Overall Cognitive Status: Within Functional Limits for tasks assessed Arousal/Alertness: Awake/alert Orientation Level: Oriented X4  Sensation/Coordination/Edema Coordination Gross Motor Movements are Fluid and Coordinated: No Fine Motor Movements are Fluid and Coordinated: No Edema Edema: Rt wrist crease: 17cm, MCPJ: 22.5cm Lt: wrist crease: 16.75cm, MCPJ: 22cm  Additional Assessments RUE AROM (degrees) RUE Overall AROM Comments: T: 60/74, P:82/70/52, M: 66/50/56, R: 40/60/68, S: 0/60/66 Right Wrist Extension: 42 Degrees Right Wrist Flexion: 52 Degrees Right Composite Finger Flexion:  (3cm from palm) RUE Strength Right Wrist Flexion: 3/5 Right Wrist Extension: 3/5 Grip (lbs): 5 3 Point Pinch:  (unable to complete with ring and small finger) LUE Strength Grip (lbs): 55 Right Hand Strength - Pinch (lbs) 3 Point Pinch:  (unable to complete with ring and small finger)   Exercise/Treatments Wrist Exercises Wrist Flexion: PROM;10 reps Wrist Extension: PROM;10 reps     Hand Exercises MCPJ Flexion: PROM;10 reps MCPJ Extension: PROM;10 reps PIPJ Flexion: PROM;10 reps PIPJ Extension: PROM;10 reps DIPJ Flexion: PROM;10 reps DIPJ Extension: PROM;10 reps Thumb Opposition: AROM, to each digit, 5 reps     Manual Therapy Manual Therapy: Myofascial release Myofascial Release: MFR and manual stretching to Right hand wrist, all finger MCP joints, PIP joints, DIP joints to decrease fascial restrictions and increase joint mobility within a pain free zone.   Occupational Therapy Assessment and Plan OT Assessment and Plan Clinical Impression Statement: A:  Patient presents with increased pain, edema, and fascial restrictions and decreased AROM, strength, and coordination s/p right boxers fracture.  These deficits are causing decreased independence with all B/IADLs and leisure activities Pt  will benefit  from skilled therapeutic intervention in order to improve on the following deficits: Increased edema;Impaired UE functional use;Increased fascial restricitons;Decreased range of motion;Decreased strength;Decreased coordination;Pain Rehab Potential: Excellent OT Frequency: Min 2X/week OT Duration: 4 weeks OT Treatment/Interventions: Self-care/ADL training;Therapeutic activities;Therapeutic exercise;Manual therapy;Modalities;Patient/family education OT Plan: P:  Skilled OT intervention to decrease pain, edema, and fascial restrictions while improving AROM, strength, and coordination needed to return to prior level of independence with all BIADLs and leisure activities.  Treatment Plan:  MFR and manual stretching toflexor and extensors of wrist, hand and all digits.  A/PROM of wrist, and hand.  Tendon glides, active hand exercises, yellow tputty, weighted wrist stretch, coordination training, edema reduction. Progress as tolerated.   Goals Short Term Goals Time to Complete Short Term Goals: 2 weeks Short Term Goal 1: Patient will be educated on HEP.  Short Term Goal 2: Patient will increase AROM by 25% in his right wrist, and hand for increased ability to eat a meal with utensils. Short Term Goal 3: Patient will have 3+/5 or greater strength in his right arm for increased ability to hold a writing utensil to prepare for writing. Short Term Goal 4: Patient will increase his right grip strength by 5 pounds and pinch strength by 2 pounds for increased ability to open jars and containers. Short Term Goal 5: Patient will decrease edema in his right wrist and hand  by .25 cm. Additional Short Term Goals?: Yes Short Term Goal 6: Patient will complete the nine hole peg test in 20 seconds or less for increased fine motor coordination needed for fastening shirt buttons. Short Term Goal 7: Patient will decrease fascial restrictions to minimal in his right wrist and hand. Short Term Goal 8: Patient  will decrease pain in his right wrist and hand to 3/10 or less when completing daily activities Long Term Goals Time to Complete Long Term Goals: 4 weeks Long Term Goal 1: Patient will return to prior level of indepedence for all daily and leisure activities.  Long Term Goal 2: Patient will increase AROM to WNL in his right wrist, and hand for increased ability to dial a phone number with right hand. Long Term Goal 3: Patient will have 4+/5 or greater strength in his right arm for increased ability to lift an item weighing 1# or more without dropping it..    Long Term Goal 4: Patient will increase his right grip strength by 15 pounds and pinch strength by 8 pounds for increased ability to squeeze washclothes. Long Term Goal 5: Patient will decrease edema in his right wrist  by .5 cm. Additional Long Term Goals?: Yes Long Term Goal 6: Patient will decrease fascial restrictions to trace in his right wrist and hand.   Long Term Goal 7: Patient will decrease pain in his right wrist and hand to 1/10 or less when completing daily activities.  Problem List Patient Active Problem List   Diagnosis Date Noted  . Fx boxers 08/23/2012  . Weakness of right arm 08/23/2012  . Lack of coordination 08/23/2012  . LLQ abdominal pain 07/06/2012    End of Session Activity Tolerance: Patient tolerated treatment well General Behavior During Therapy: [facility] for tasks assessed/performed OT Plan of Care OT Home Exercise Plan: AROM- hand and wrist exercises OT Patient Instructions: handout - scanned Consulted and Agree with Plan of Care: Patient   Limmie Patricia, OTR/L,CBIS   08/23/2012, 2:14 PM  Physician Documentation Your signature is required to indicate approval of the treatment plan as stated above.  Please sign and either send electronically or make a copy of this report for your files and return this physician signed original.  Please mark one 1.__approve of plan  2. ___approve of plan with the  following conditions.   ______________________________                                                          _____________________ Physician Signature                                                                                                             Date

## 2012-08-25 ENCOUNTER — Ambulatory Visit (HOSPITAL_COMMUNITY)
Admission: RE | Admit: 2012-08-25 | Discharge: 2012-08-25 | Disposition: A | Discharge status: Home / Self Care | Payer: Medicaid Other | Source: Ambulatory Visit | Attending: Orthopedic Surgery | Admitting: Orthopedic Surgery

## 2012-08-25 NOTE — Progress Notes (Signed)
Occupational Therapy Treatment Patient Details  Name: Tony Turner MRN: [id number] Date of Birth: 15-Dec-1990  Today's Date: 08/25/2012 Time: 1345-1430 OT Time Calculation (min): 45 min MFR 4098-1191 30' Therex 4782-9562 15'  Visit#: 2 of 8  Re-eval: 09/20/12    Authorization: Medicaid - waiting for approval  Authorization Time Period:    Authorization Visit#: 2 of    Subjective Symptoms/Limitations Symptoms: S: I did the exercises yesterday but my hand has been killing me today. I was only able to wear the brace 1 hour last night. It's been hurting me a lot.  Pain Assessment Currently in Pain?: Yes Pain Score: 5  Pain Location: Hand Pain Orientation: Right Pain Type: Acute pain  Precautions/Restrictions  Precautions Precautions: None  Exercise/Treatments Hand Exercises MCPJ Flexion: PROM;10 reps MCPJ Extension: PROM;10 reps PIPJ Flexion: PROM;10 reps PIPJ Extension: PROM;10 reps DIPJ Flexion: PROM;10 reps DIPJ Extension: PROM;10 reps Digit Composite Abduction: AROM;10 reps Digit Composite Adduction: AROM;10 reps Theraputty - Flatten: yellow Theraputty - Roll: yellow Theraputty - Grip: yellow Thumb Opposition: AROM, to each digit, 5 reps     Manual Therapy Manual Therapy: Myofascial release Myofascial Release: MFR and manual stretching to Right hand wrist, all finger MCP joints, PIP joints, DIP joints to decrease fascial restrictions and increase joint mobility within a pain free zone.   Occupational Therapy Assessment and Plan OT Assessment and Plan Clinical Impression Statement: A: A lot of pain and redness seen in right hand this date. More than likely caused by brace that patient has been wearing. Educated patient to not wear brace if it causes any pain discomfort, redness or numbness. Patient will being in brace to next tx session to determine if it needs to be adjusted or if a new splint will be made.  Skin was very ashy in color. Patient has very sensitive  skin and he may have a rash casued by splint.  OT Plan: P: Fabricate new ulnar gutter splint if needed. (Patient will bring in splint that he has been wearing). Cont. to work on manual stretching and MFR to increase joint mobility and reduce pain level.    Goals Short Term Goals Time to Complete Short Term Goals: 2 weeks Short Term Goal 1: Patient will be educated on HEP.  Short Term Goal 1 Progress: Progressing toward goal Short Term Goal 2: Patient will increase AROM by 25% in his right wrist, and hand for increased ability to eat a meal with utensils. Short Term Goal 2 Progress: Progressing toward goal Short Term Goal 3: Patient will have 3+/5 or greater strength in his right arm for increased ability to hold a writing utensil to prepare for writing. Short Term Goal 3 Progress: Progressing toward goal Short Term Goal 4: Patient will increase his right grip strength by 5 pounds and pinch strength by 2 pounds for increased ability to open jars and containers. Short Term Goal 4 Progress: Progressing toward goal Short Term Goal 5: Patient will decrease edema in his right wrist and hand  by .25 cm. Short Term Goal 5 Progress: Progressing toward goal Additional Short Term Goals?: Yes Short Term Goal 6: Patient will complete the nine hole peg test in 20 seconds or less for increased fine motor coordination needed for fastening shirt buttons. Short Term Goal 6 Progress: Progressing toward goal Short Term Goal 7: Patient will decrease fascial restrictions to minimal in his right wrist and hand. Short Term Goal 7 Progress: Progressing toward goal Short Term Goal 8: Patient will decrease pain  in his right wrist and hand to 3/10 or less when completing daily activities Short Term Goal 8 Progress: Progressing toward goal Long Term Goals Time to Complete Long Term Goals: 4 weeks Long Term Goal 1: Patient will return to prior level of indepedence for all daily and leisure activities.  Long Term Goal 1  Progress: Progressing toward goal Long Term Goal 2: Patient will increase AROM to WNL in his right wrist, and hand for increased ability to dial a phone number with right hand. Long Term Goal 2 Progress: Progressing toward goal Long Term Goal 3: Patient will have 4+/5 or greater strength in his right arm for increased ability to lift an item weighing 1# or more without dropping it..    Long Term Goal 3 Progress: Progressing toward goal Long Term Goal 4: Patient will increase his right grip strength by 15 pounds and pinch strength by 8 pounds for increased ability to squeeze washclothes. Long Term Goal 4 Progress: Progressing toward goal Long Term Goal 5: Patient will decrease edema in his right wrist  by .5 cm. Long Term Goal 5 Progress: Progressing toward goal Additional Long Term Goals?: Yes Long Term Goal 6: Patient will decrease fascial restrictions to trace in his right wrist and hand.   Long Term Goal 6 Progress: Progressing toward goal Long Term Goal 7: Patient will decrease pain in his right wrist and hand to 1/10 or less when completing daily activities. Long Term Goal 7 Progress: Progressing toward goal  Problem List Patient Active Problem List   Diagnosis Date Noted  . Fx boxers 08/23/2012  . Weakness of right arm 08/23/2012  . Lack of coordination 08/23/2012  . LLQ abdominal pain 07/06/2012    End of Session Activity Tolerance: Patient tolerated treatment well General Behavior During Therapy: [facility] for tasks assessed/performed   Limmie Patricia, OTR/L,CBIS   08/25/2012, 4:23 PM

## 2012-08-29 ENCOUNTER — Inpatient Hospital Stay (HOSPITAL_COMMUNITY): Admission: RE | Admit: 2012-08-29 | Payer: Medicaid Other | Source: Ambulatory Visit

## 2012-08-31 ENCOUNTER — Inpatient Hospital Stay (HOSPITAL_COMMUNITY): Admission: RE | Admit: 2012-08-31 | Payer: Medicaid Other | Source: Ambulatory Visit | Admitting: Specialist

## 2012-09-05 ENCOUNTER — Ambulatory Visit (HOSPITAL_COMMUNITY)
Admission: RE | Admit: 2012-09-05 | Discharge: 2012-09-05 | Disposition: A | Discharge status: Home / Self Care | Payer: Medicaid Other | Source: Ambulatory Visit | Attending: Orthopedic Surgery | Admitting: Orthopedic Surgery

## 2012-09-05 NOTE — Progress Notes (Signed)
Occupational Therapy Treatment Patient Details  Name: Tony Turner MRN: [id number] Date of Birth: December 22, 1990  Today's Date: 09/05/2012 Time: 9629-5284 OT Time Calculation (min): 45 min MFR 1324-4010 15' Therex 2725-3664 30'  Visit#: 3 of 8  Re-eval: 09/20/12    Authorization: Medicaid   Authorization Time Period: 7 visit approved. (08/24/12-09/11/12)  Authorization Visit#: 3 of 7  Subjective Symptoms/Limitations Symptoms: S: My knuckle doesn't want to bend and that worries me.  Pain Assessment Currently in Pain?: Yes Pain Score: 9  Pain Location: Hand Pain Orientation: Right Pain Type: Surgical pain  Precautions/Restrictions  Precautions Precautions: None  Exercise/Treatments Hand Exercises MCPJ Flexion: PROM;10 reps MCPJ Extension: PROM;10 reps PIPJ Flexion: PROM;10 reps PIPJ Extension: PROM;10 reps DIPJ Flexion: PROM;10 reps DIPJ Extension: PROM;10 reps Theraputty - Flatten: yellow Theraputty - Roll: yellow Thumb Opposition: AROM, 5x to small finger Sponges: 6,7,16 Other Hand Exercises: clothespins placed/removed from containers edge susing ring and small finger 15 pins used. Mod difficulty. Used middle, ring, and small finger to remove.     Manual Therapy Manual Therapy: Myofascial release Myofascial Release: MFR and manual stretching to Right hand wrist, all finger MCP joints, PIP joints, DIP joints to decrease fascial restrictions and increase joint mobility within a pain free zone.  Occupational Therapy Assessment and Plan OT Assessment and Plan Clinical Impression Statement: A: Patient concerned about limited range of motion in small finger MCP joint. Educated patient on joint mobility, scar tissue and expectations for therapy. Release of scar tissue during manial stretching felt this date.  OT Plan: P: Fabricate new ulnar gutter splint if needed. (Patient will bring in splint that he has been wearing). Cont. to work on manual stretching and MFR to increase  joint mobility and reduce pain level.    Goals Short Term Goals Time to Complete Short Term Goals: 2 weeks Short Term Goal 1: Patient will be educated on HEP.  Short Term Goal 1 Progress: Progressing toward goal Short Term Goal 2: Patient will increase AROM by 25% in his right wrist, and hand for increased ability to eat a meal with utensils. Short Term Goal 2 Progress: Progressing toward goal Short Term Goal 3: Patient will have 3+/5 or greater strength in his right arm for increased ability to hold a writing utensil to prepare for writing. Short Term Goal 3 Progress: Progressing toward goal Short Term Goal 4: Patient will increase his right grip strength by 5 pounds and pinch strength by 2 pounds for increased ability to open jars and containers. Short Term Goal 4 Progress: Progressing toward goal Short Term Goal 5: Patient will decrease edema in his right wrist and hand  by .25 cm. Short Term Goal 5 Progress: Progressing toward goal Additional Short Term Goals?: Yes Short Term Goal 6: Patient will complete the nine hole peg test in 20 seconds or less for increased fine motor coordination needed for fastening shirt buttons. Short Term Goal 6 Progress: Progressing toward goal Short Term Goal 7: Patient will decrease fascial restrictions to minimal in his right wrist and hand. Short Term Goal 7 Progress: Progressing toward goal Short Term Goal 8: Patient will decrease pain in his right wrist and hand to 3/10 or less when completing daily activities Short Term Goal 8 Progress: Progressing toward goal Long Term Goals Time to Complete Long Term Goals: 4 weeks Long Term Goal 1: Patient will return to prior level of indepedence for all daily and leisure activities.  Long Term Goal 1 Progress: Progressing toward goal Long Term  Goal 2: Patient will increase AROM to WNL in his right wrist, and hand for increased ability to dial a phone number with right hand. Long Term Goal 2 Progress: Progressing  toward goal Long Term Goal 3: Patient will have 4+/5 or greater strength in his right arm for increased ability to lift an item weighing 1# or more without dropping it..    Long Term Goal 3 Progress: Progressing toward goal Long Term Goal 4: Patient will increase his right grip strength by 15 pounds and pinch strength by 8 pounds for increased ability to squeeze washclothes. Long Term Goal 4 Progress: Progressing toward goal Long Term Goal 5: Patient will decrease edema in his right wrist  by .5 cm. Long Term Goal 5 Progress: Progressing toward goal Additional Long Term Goals?: Yes Long Term Goal 6: Patient will decrease fascial restrictions to trace in his right wrist and hand.   Long Term Goal 6 Progress: Progressing toward goal Long Term Goal 7: Patient will decrease pain in his right wrist and hand to 1/10 or less when completing daily activities. Long Term Goal 7 Progress: Progressing toward goal  Problem List Patient Active Problem List   Diagnosis Date Noted  . Fx boxers 08/23/2012  . Weakness of right arm 08/23/2012  . Lack of coordination 08/23/2012  . LLQ abdominal pain 07/06/2012    End of Session Activity Tolerance: Patient tolerated treatment well General Behavior During Therapy: [facility] for tasks assessed/performed   Limmie Patricia, OTR/L,CBIS   09/05/2012, 3:41 PM

## 2012-09-08 ENCOUNTER — Ambulatory Visit (HOSPITAL_COMMUNITY)
Admission: RE | Admit: 2012-09-08 | Discharge: 2012-09-08 | Disposition: A | Discharge status: Home / Self Care | Payer: Medicaid Other | Source: Ambulatory Visit | Attending: Family Medicine | Admitting: Family Medicine

## 2012-09-08 DIAGNOSIS — M25549 Pain in joints of unspecified hand: Secondary | ICD-10-CM | POA: Insufficient documentation

## 2012-09-08 DIAGNOSIS — IMO0001 Reserved for inherently not codable concepts without codable children: Secondary | ICD-10-CM | POA: Insufficient documentation

## 2012-09-08 DIAGNOSIS — R279 Unspecified lack of coordination: Secondary | ICD-10-CM | POA: Insufficient documentation

## 2012-09-08 DIAGNOSIS — M6281 Muscle weakness (generalized): Secondary | ICD-10-CM | POA: Insufficient documentation

## 2012-09-08 NOTE — Progress Notes (Signed)
Occupational Therapy Treatment Patient Details  Name: Tony Turner MRN: [id number] Date of Birth: 1990/09/22  Today's Date: 09/08/2012 Time: 7846-9629 OT Time Calculation (min): 39 min MFR 5284-1324 21' Therex 4010-2725 18'  Visit#: 4 of 8  Re-eval: 09/20/12    Authorization: Medicaid   Authorization Time Period: 7 visit approved. (08/24/12-09/11/12)  Authorization Visit#: 4 of 7  Subjective Symptoms/Limitations Symptoms: S: Today it only hurt in the morning when I woke up. Once I got moving it was fine.  Pain Assessment Currently in Pain?: No/denies  Precautions/Restrictions  Precautions Precautions: None  Exercise/Treatments Hand Exercises MCPJ Flexion: PROM;10 reps MCPJ Extension: PROM;10 reps PIPJ Flexion: PROM;10 reps PIPJ Extension: PROM;10 reps DIPJ Flexion: PROM;10 reps DIPJ Extension: PROM;10 reps Digit Composite Abduction: AROM;10 reps Digit Composite Adduction: AROM;10 reps Sponges: 14,16,25 Tendon Glides: 5X Small Pegboard: Layfette pegboard using left ring and small finger to place and remove pegs from board; increased time needed with min-mod pain level.      Manual Therapy Manual Therapy: Myofascial release Myofascial Release: MFR and manual stretching to Right hand wrist, all finger MCP joints, PIP joints, DIP joints to decrease fascial restrictions and increase joint mobility within a pain free zone.  Occupational Therapy Assessment and Plan OT Assessment and Plan Clinical Impression Statement: A: Patient experiencing less tenderness during MFR. Pain during manual stretching of small MCP joint. Scar tissue release in small MCP joint. Patient did not remember to bring splint in this date for adjustments. OT Plan: P: Fabricate new ulnar gutter splint if needed. (Patient will bring in splint that he has been wearing. He's forgotten last two tx sessions). Cont. to work on manual stretching and MFR to increase joint mobility and reduce pain level.     Goals Short Term Goals Time to Complete Short Term Goals: 2 weeks Short Term Goal 1: Patient will be educated on HEP.  Short Term Goal 2: Patient will increase AROM by 25% in his right wrist, and hand for increased ability to eat a meal with utensils. Short Term Goal 3: Patient will have 3+/5 or greater strength in his right arm for increased ability to hold a writing utensil to prepare for writing. Short Term Goal 4: Patient will increase his right grip strength by 5 pounds and pinch strength by 2 pounds for increased ability to open jars and containers. Short Term Goal 5: Patient will decrease edema in his right wrist and hand  by .25 cm. Additional Short Term Goals?: Yes Short Term Goal 6: Patient will complete the nine hole peg test in 20 seconds or less for increased fine motor coordination needed for fastening shirt buttons. Short Term Goal 7: Patient will decrease fascial restrictions to minimal in his right wrist and hand. Short Term Goal 8: Patient will decrease pain in his right wrist and hand to 3/10 or less when completing daily activities Long Term Goals Time to Complete Long Term Goals: 4 weeks Long Term Goal 1: Patient will return to prior level of indepedence for all daily and leisure activities.  Long Term Goal 2: Patient will increase AROM to WNL in his right wrist, and hand for increased ability to dial a phone number with right hand. Long Term Goal 3: Patient will have 4+/5 or greater strength in his right arm for increased ability to lift an item weighing 1# or more without dropping it..    Long Term Goal 4: Patient will increase his right grip strength by 15 pounds and pinch strength by 8  pounds for increased ability to squeeze washclothes. Long Term Goal 5: Patient will decrease edema in his right wrist  by .5 cm. Additional Long Term Goals?: Yes Long Term Goal 6: Patient will decrease fascial restrictions to trace in his right wrist and hand.   Long Term Goal 7:  Patient will decrease pain in his right wrist and hand to 1/10 or less when completing daily activities.  Problem List Patient Active Problem List   Diagnosis Date Noted  . Fx boxers 08/23/2012  . Weakness of right arm 08/23/2012  . Lack of coordination 08/23/2012  . LLQ abdominal pain 07/06/2012    End of Session Activity Tolerance: Patient tolerated treatment well General Behavior During Therapy: [facility] for tasks assessed/performed   Limmie Patricia, OTR/L,CBIS   09/08/2012, 3:39 PM

## 2012-09-11 ENCOUNTER — Inpatient Hospital Stay (HOSPITAL_COMMUNITY): Admission: RE | Admit: 2012-09-11 | Payer: Medicaid Other | Source: Ambulatory Visit | Admitting: Specialist

## 2012-09-14 ENCOUNTER — Inpatient Hospital Stay (HOSPITAL_COMMUNITY): Admission: RE | Admit: 2012-09-14 | Payer: Medicaid Other | Source: Ambulatory Visit

## 2012-09-19 ENCOUNTER — Telehealth (HOSPITAL_COMMUNITY): Payer: Self-pay

## 2012-09-19 ENCOUNTER — Inpatient Hospital Stay (HOSPITAL_COMMUNITY): Admission: RE | Admit: 2012-09-19 | Payer: Medicaid Other | Source: Ambulatory Visit

## 2012-09-19 NOTE — Telephone Encounter (Signed)
Spoke with patient regarding change in Medicaid coverage since patient has recently turned 22 y/o. Informed patient that Medicaid will not provide coverage for additional visits and he will be responsible for out of pocket expenses that may range from $50-130 per visit. Patient unable to financially cover out of pocket expenses. Patient wished to cancel remaining visits and wait to speak with Medicaid office to try and get additional visits covered.   Limmie Patricia, OTR/L,CBIS  09/19/12 3:28PM

## 2012-09-22 ENCOUNTER — Ambulatory Visit (HOSPITAL_COMMUNITY): Payer: Medicaid Other

## 2012-09-26 ENCOUNTER — Ambulatory Visit (HOSPITAL_COMMUNITY): Payer: Medicaid Other

## 2012-09-28 ENCOUNTER — Ambulatory Visit (HOSPITAL_COMMUNITY): Payer: Medicaid Other | Admitting: Specialist

## 2013-02-07 ENCOUNTER — Emergency Department (HOSPITAL_COMMUNITY)
Admission: EM | Admit: 2013-02-07 | Discharge: 2013-02-08 | Disposition: A | Discharge status: Home / Self Care | Payer: Medicaid Other | Attending: Emergency Medicine | Admitting: Emergency Medicine

## 2013-02-07 ENCOUNTER — Encounter (HOSPITAL_COMMUNITY): Payer: Self-pay | Admitting: Emergency Medicine

## 2013-02-07 DIAGNOSIS — K120 Recurrent oral aphthae: Secondary | ICD-10-CM | POA: Insufficient documentation

## 2013-02-07 NOTE — ED Notes (Addendum)
Patient was seen at [facility] on 01/30/13 for fever, chills, body aches and ulcer on lower lip and was dx w/URI and hypokalemia. Was treated w/Zithromax and PO KCL.  On 02/01/13 returned for increased number of ulcers on lip which were scabbed and painful. Thought Z-pak was responsible so only took 2 days of it. Since that time, he has developed more ucler on lower lip and behind upper teeth.  Lower lip is swollen and blistered.  Speech is affected from ulcers and they are very painful.  Has used liquid numbing fluid w/out much relief.

## 2013-02-08 MED ORDER — MAGIC MOUTHWASH W/LIDOCAINE: 5.0000 mL | Freq: Three times a day (TID) | ORAL | Status: DC | PRN

## 2013-02-08 NOTE — ED Provider Notes (Signed)
CSN: [id number]     Arrival date & time 02/07/13  2327 History   First MD Initiated Contact with Patient 02/08/13 0000     Chief Complaint  Patient presents with  . oral ulcers     The history is provided by the patient and a significant other.  pt presents for ulcers on lips and gums for over a week He reports recent flu like illness (fever/cough/myalgias) and most of those symptoms have improved, but reports the lip ulcers have not improved.  He has never had this before.  He reports the ulcers were present before any meds were given at outside hospital.  No difficulty swallowing and no sore throat is reported.  He is not on any daily medications.   His course is worsening, eating and drinking worsen his symptoms, rest improves his symptoms   History reviewed. No pertinent past medical history. Past Surgical History  Procedure Laterality Date  . Hand fracture surgery     Family History  Problem Relation Age of Onset  . Arthritis Mother    History  Substance Use Topics  . Smoking status: Never Smoker   . Smokeless tobacco: Never Used  . Alcohol Use: Yes     Comment: occasional    Review of Systems  HENT: Negative for trouble swallowing.   Gastrointestinal: Negative for vomiting.    Allergies  Penicillins and Amoxicillin  Home Medications   Current Outpatient Rx  Name  Route  Sig  Dispense  Refill  . Alum & Mag Hydroxide-Simeth (MAGIC MOUTHWASH W/LIDOCAINE) SOLN   Oral   Take 5 mLs by mouth 3 (three) times daily as needed for mouth pain.   30 mL   0   . dicyclomine (BENTYL) 20 MG tablet      One po every 8 hours for abd cramps   20 tablet   0   . oxyCODONE-acetaminophen (PERCOCET/ROXICET) 5-325 MG per tablet   Oral   Take 1 tablet by mouth every 6 (six) hours as needed for pain.   30 tablet   0    BP 134/83  Pulse 103  Temp(Src) 98.5 F (36.9 C) (Oral)  Resp 16  Ht 6\' 2"  (1.88 m)  Wt 160 lb (72.576 kg)  BMI 20.53 kg/m2  SpO2 100% Physical  Exam CONSTITUTIONAL: Well developed/well nourished HEAD: Normocephalic/atraumatic EYES: EOMI/PERRL, no conjunctival lesions noted ENMT: Mucous membranes moist, lesions noted to upper and lower lips that are dried without active bleeding/drainage.  No herpetic lesions noted.  A few lesions noted on gingiva.  There are no lesions in oropharynx and uvula midline.  NECK: supple no meningeal signs CV: S1/S2 noted, no murmurs/rubs/gallops noted LUNGS: Lungs are clear to auscultation bilaterally, no apparent distress ABDOMEN: soft, nontender, no rebound or guarding NEURO: Pt is awake/alert, moves all extremitiesx4 EXTREMITIES: pulses normal, full ROM SKIN: warm, color normal PSYCH: no abnormalities of mood noted  ED Course  Procedures (including critical care time) Labs Review Labs Reviewed - No data to display Imaging Review No results found.  EKG Interpretation   None       MDM   1. Aphthous ulcer of mouth    Nursing notes including past medical history and social history reviewed and considered in documentation  Pt well appearing, requesting meds for pain control, magic mouthwash with lidocaine given advised not to ingest.  Advised to f/u with PCP if symptoms persist over a week as may need further testing.      Joya Gaskins,  MD 02/08/13 [phone]

## 2013-02-08 NOTE — Discharge Instructions (Signed)
Please use medication by placing on lips and swishing in your mouth and spitting out If it is not improved in a week, please see your primary doctor for further testing

## 2013-04-21 IMAGING — CT CT ABD-PELV W/ CM
2 of 4 series · 16 of 46 positions shown, 18 images · IV contrast (Omnipaque 300)
Comparison: Radiography 01/30/2010

CLINICAL DATA: Abdominal pain with nausea and vomiting.

CT ABDOMEN AND PELVIS WITH CONTRAST
TECHNIQUE: Multidetector CT imaging of the abdomen and pelvis was
performed following the standard protocol during bolus
administration of intravenous contrast.
Contrast: 100mL OMNIPAQUE IOHEXOL 300 MG/ML  SOLN

[Series 2: abd_pel_with 5.0 b40f · axial · 0.67mm/px · z∈[-467,-42]mm · 13 of 93 slices shown, 15 images]
[im 4/93  soft-tissue]
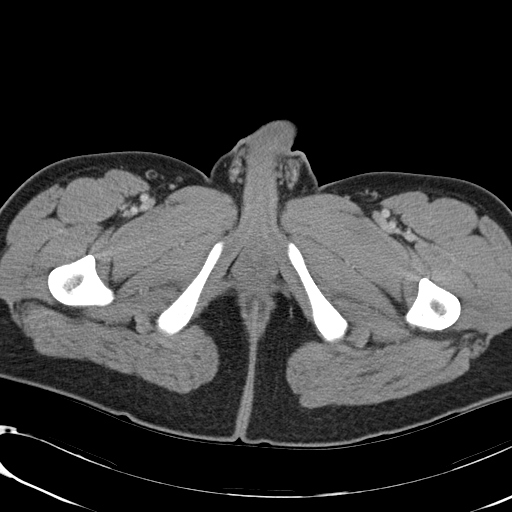
[im 4/93  bone]
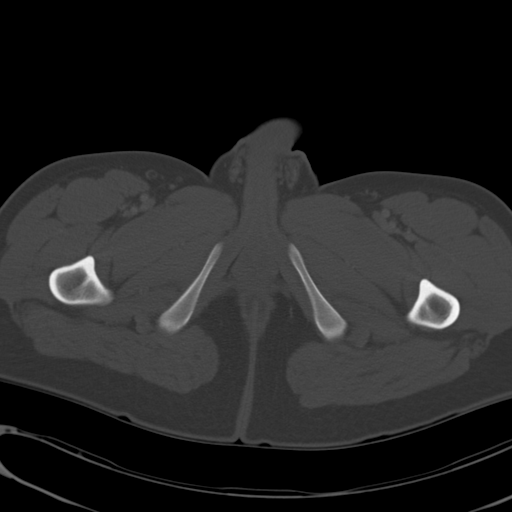
[im 12/93  soft-tissue]
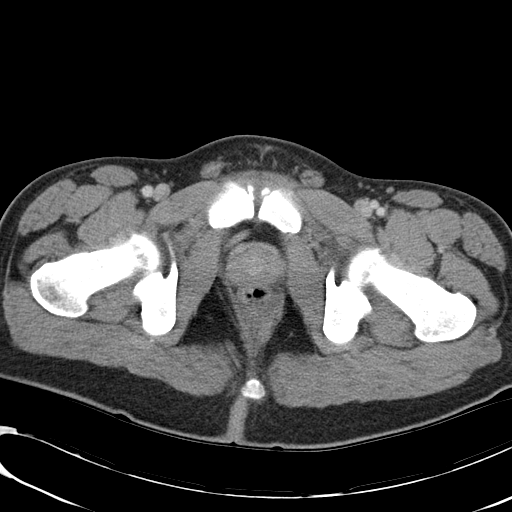
[im 20/93  soft-tissue]
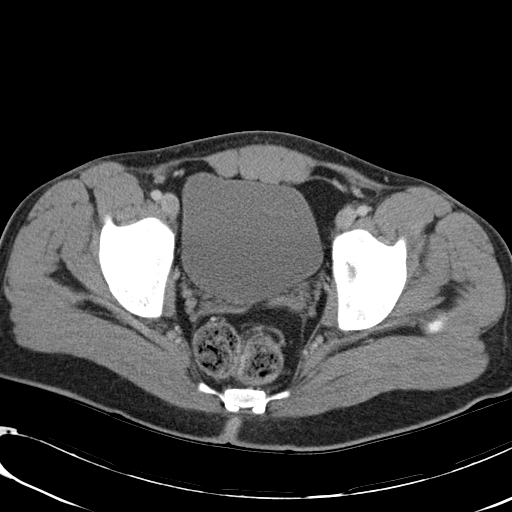
[im 27/93  soft-tissue]
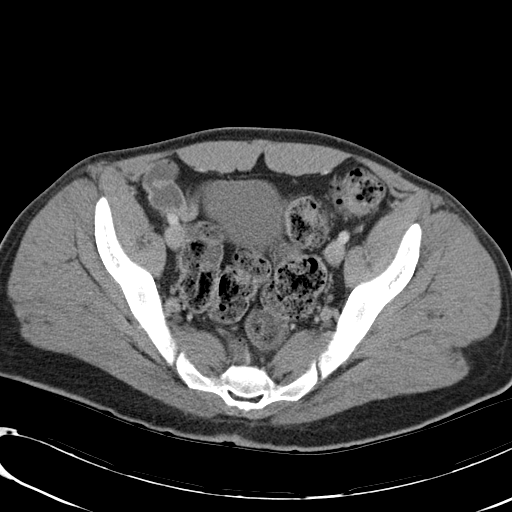
[im 31/93  soft-tissue]
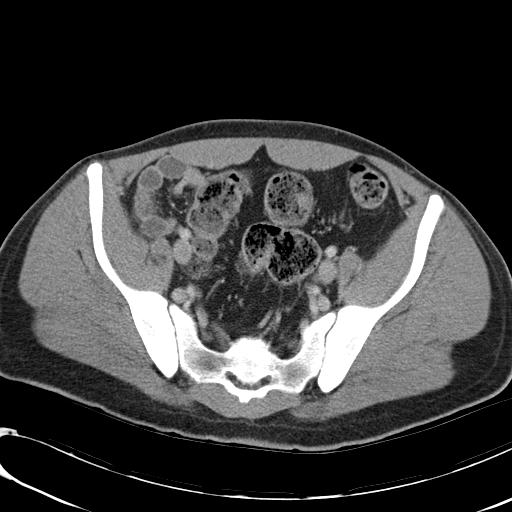
[im 39/93  soft-tissue]
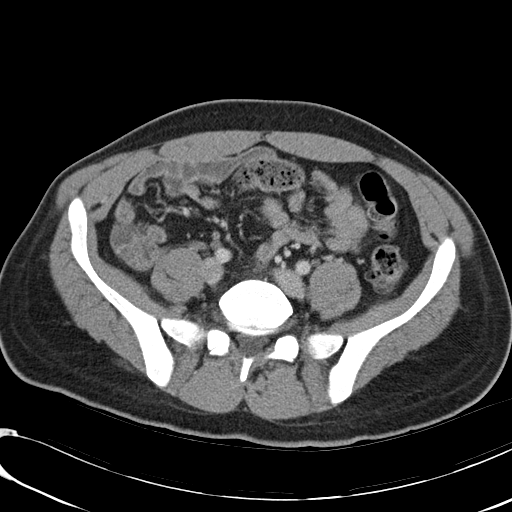
[im 47/93  soft-tissue]
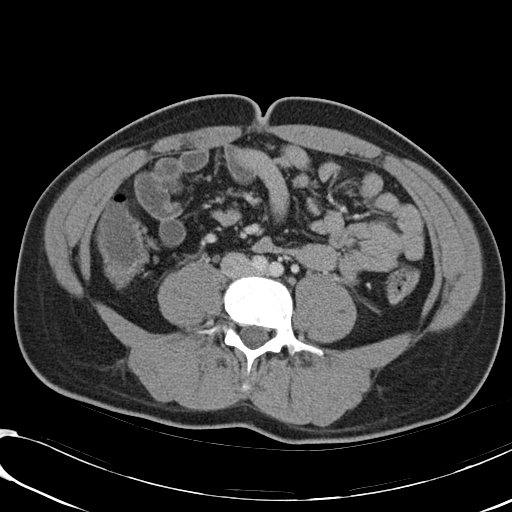
[im 54/93  soft-tissue]
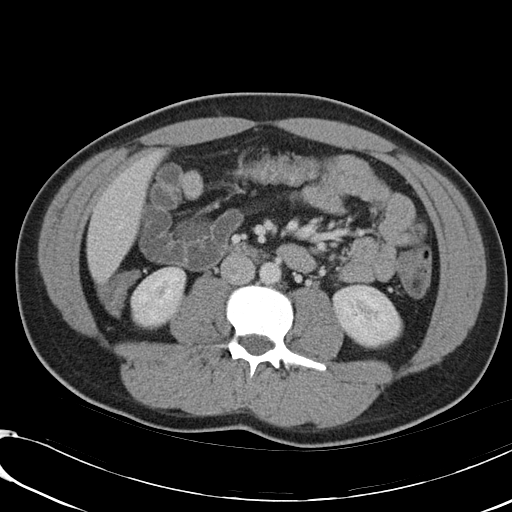
[im 62/93  soft-tissue]
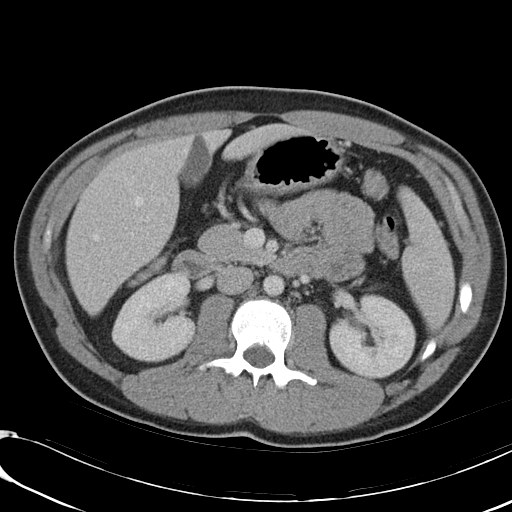
[im 62/93  bone]
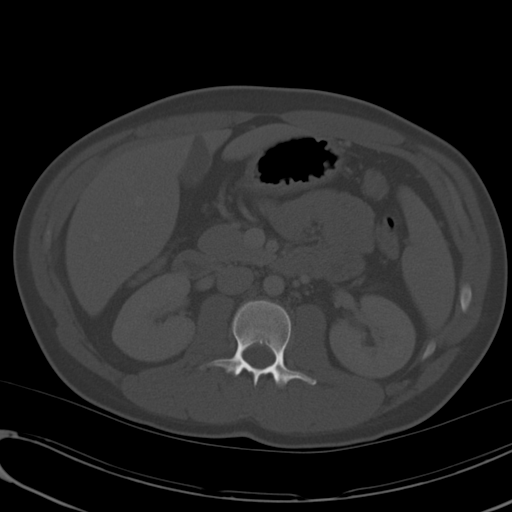
[im 66/93  soft-tissue]
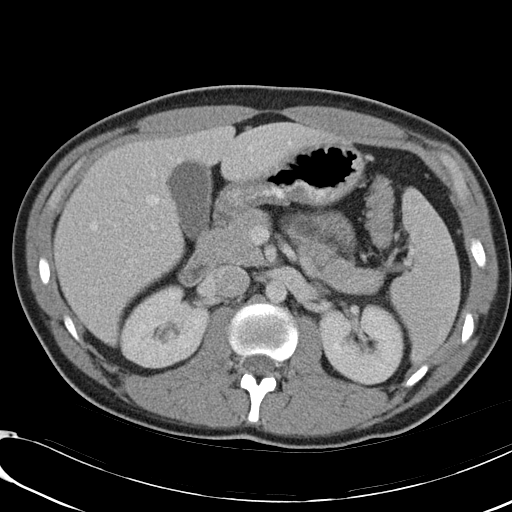
[im 73/93  soft-tissue]
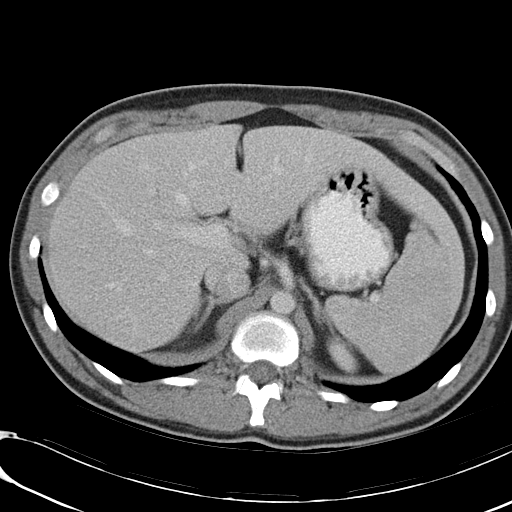
[im 81/93  soft-tissue]
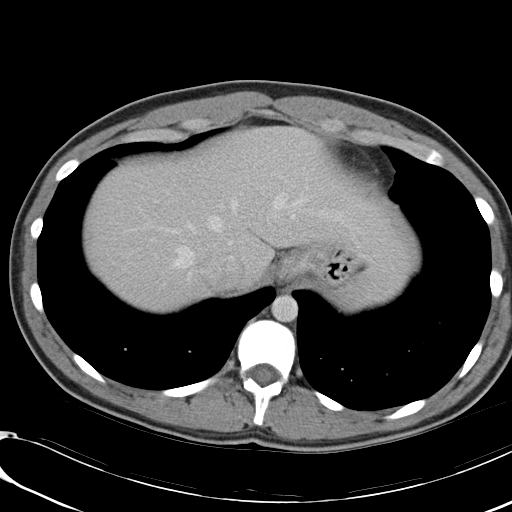
[im 89/93  soft-tissue]
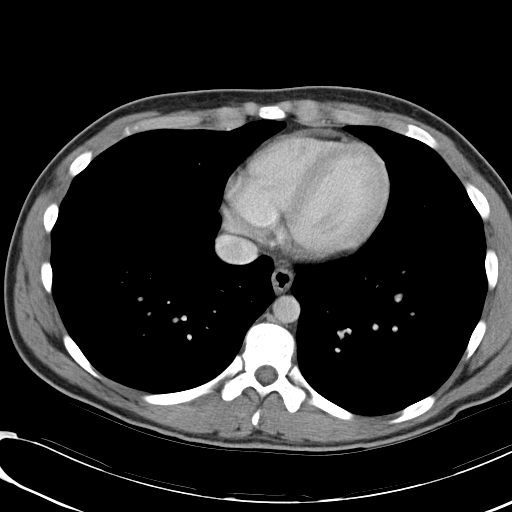

[Series 4: abd_pel_with 3.0 spo · coronal · 0.65mm/px · 3 of 75 slices shown]
[im 25/75  soft-tissue]
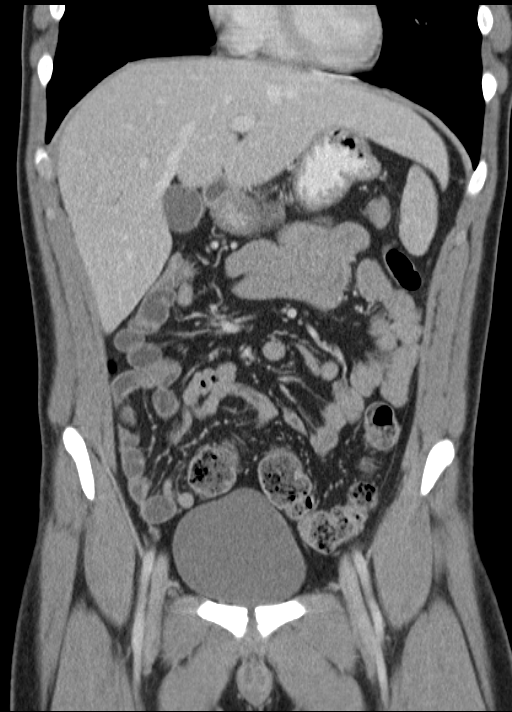
[im 33/75  soft-tissue]
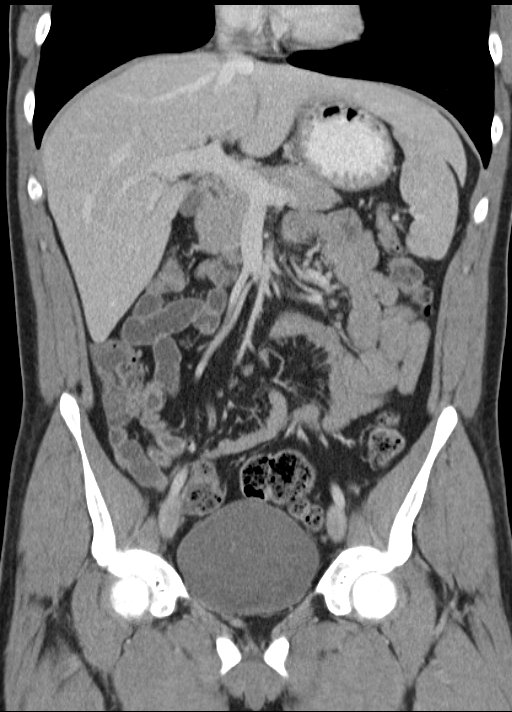
[im 42/75  soft-tissue]
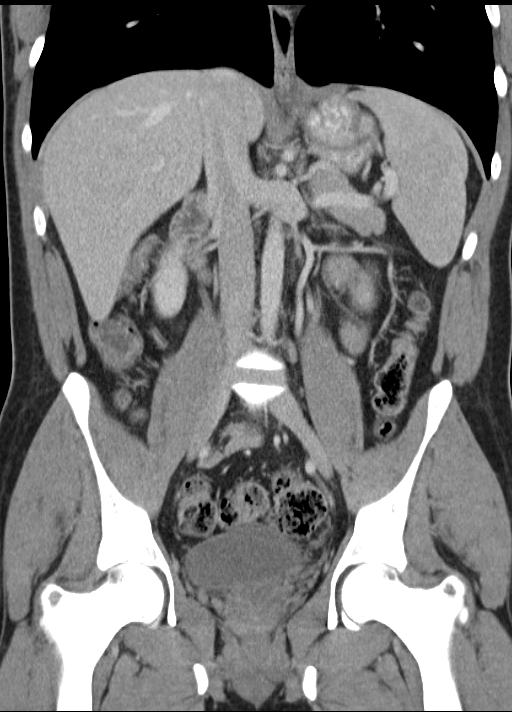

[16 of 46 positions shown; findings below may reference images not displayed]

FINDINGS: Lung bases are clear.  No pleural or pericardial fluid.
The liver has a normal appearance without focal lesions or biliary
ductal dilatation.  No calcified gallstones.  The spleen is normal.
The pancreas is normal.  The adrenal glands are normal.  The
kidneys are normal.  The aorta and IVC are normal.  No
retroperitoneal mass or adenopathy.  No free intraperitoneal fluid
or air.

The appendix is normal.  No other bowel pathology is evident.
Bladder, prostate gland and seminal vesicles are unremarkable.  No
significant bony finding.
IMPRESSION: Normal CT scan of the abdomen and pelvis.

## 2015-09-28 ENCOUNTER — Encounter (HOSPITAL_COMMUNITY): Payer: Self-pay | Admitting: Emergency Medicine

## 2015-09-28 ENCOUNTER — Emergency Department (HOSPITAL_COMMUNITY)
Admission: EM | Admit: 2015-09-28 | Discharge: 2015-09-28 | Disposition: A | Discharge status: Home / Self Care | Payer: Medicaid Other | Attending: Emergency Medicine | Admitting: Emergency Medicine

## 2015-09-28 DIAGNOSIS — L42 Pityriasis rosea: Secondary | ICD-10-CM | POA: Diagnosis not present

## 2015-09-28 DIAGNOSIS — R21 Rash and other nonspecific skin eruption: Secondary | ICD-10-CM | POA: Diagnosis present

## 2015-09-28 NOTE — ED Provider Notes (Signed)
[facility]-EMERGENCY DEPT Provider Note   CSN: [id number] Arrival date & time: 09/28/15  [id number]   By signing my name below, I, Nelwyn SalisburyJoshua Fowler, attest that this documentation has been prepared under the direction and in the presence of Eber HongBrian Tirrell Buchberger, MD . Electronically Signed: Nelwyn SalisburyJoshua Fowler, Scribe. 09/28/2015. 9:53 AM.   History   Chief Complaint Chief Complaint  Patient presents with  . Rash   The history is provided by the patient and a significant other. No language interpreter was used.     HPI Comments:  Tony Turner is a 25 y.o. male who presents to the Emergency Department complaining of sudden onset, worsening right sided rash onset last few days - is mild, non itchy, nothing makes better or wors e- no one else with same rash - started on the L upper chest wall with largest lesion. Pt denies itching or new medications or changes in soaps   History reviewed. No pertinent past medical history.  Patient Active Problem List   Diagnosis Date Noted  . Fx boxers 08/23/2012  . Weakness of right arm 08/23/2012  . Lack of coordination 08/23/2012  . LLQ abdominal pain 07/06/2012    Past Surgical History:  Procedure Laterality Date  . Hand fracture surgery         Home Medications    Prior to Admission medications   Not on File    Family History Family History  Problem Relation Age of Onset  . Arthritis Mother     Social History Social History  Substance Use Topics  . Smoking status: Never Smoker  . Smokeless tobacco: Never Used  . Alcohol use Yes     Comment: occasional     Allergies   Penicillins and Amoxicillin   Review of Systems Review of Systems  Skin: Positive for rash.       - (negative) itching     Physical Exam Updated Vital Signs BP 135/93 (BP Location: Left Arm)   Pulse 108   Temp 98.3 F (36.8 C) (Oral)   Resp 16   Ht 6\' 2"  (1.88 m)   Wt 215 lb (97.5 kg)   SpO2 97%   BMI 27.60 kg/m   Physical Exam  Constitutional: He appears  well-developed and well-nourished.  HENT:  Head: Normocephalic and atraumatic.  Eyes: Conjunctivae are normal. Right eye exhibits no discharge. Left eye exhibits no discharge.  Pulmonary/Chest: Effort normal. No respiratory distress.  Neurological: He is alert. Coordination normal.  Skin: Skin is warm and dry. No rash noted. He is not diaphoretic. No erythema.  Multiple macular regions across arms and chest, largest of which is left upper chest ([place]) over clavicle.   Psychiatric: He has a normal mood and affect.  Nursing note and vitals reviewed.    ED Treatments / Results  DIAGNOSTIC STUDIES:  Oxygen Saturation is 97% on RA, normal by my interpretation.    COORDINATION OF CARE:  4:29 PM Discussed treatment plan with pt at bedside which includes benadryl for potential itching and pt agreed to plan.  Labs (all labs ordered are listed, but only abnormal results are displayed) Labs Reviewed - No data to display   Radiology No results found.  Procedures Procedures (including critical care time)  Medications Ordered in ED Medications - No data to display   Initial Impression / Assessment and Plan / ED Course  I have reviewed the triage vital signs and the nursing notes.  Pertinent labs & imaging results that were available during my  care of the patient were reviewed by me and considered in my medical decision making (see chart for details).  Clinical Course  Comment By Time  Well appaering, pt informed of diagnosis and treatment plan and in agreement. Likely pityriasis rosea Eber HongBrian Nickolaos Brallier, MD 08/20 1002     Final Clinical Impressions(s) / ED Diagnoses   Final diagnoses:  Pityriasis rosea    New Prescriptions There are no discharge medications for this patient.   I personally performed the services described in this documentation, which was scribed in my presence. The recorded information has been reviewed and is accurate.       Eber HongBrian Talley Casco,  MD 09/28/15 [phone]

## 2015-09-28 NOTE — ED Triage Notes (Signed)
Pt reports rash all over body since yesterday of unknown cause.  Pt denies any new usage of food, medicine, soaps, detergents.  Pt not itching .

## 2015-09-28 NOTE — ED Notes (Signed)
MD at bedside. 

## 2015-12-06 ENCOUNTER — Encounter (HOSPITAL_COMMUNITY): Payer: Self-pay

## 2015-12-06 ENCOUNTER — Emergency Department (HOSPITAL_COMMUNITY)
Admission: EM | Admit: 2015-12-06 | Discharge: 2015-12-06 | Disposition: A | Discharge status: Home / Self Care | Payer: Medicaid Other | Attending: Emergency Medicine | Admitting: Emergency Medicine

## 2015-12-06 ENCOUNTER — Emergency Department (HOSPITAL_COMMUNITY): Payer: Medicaid Other

## 2015-12-06 DIAGNOSIS — R109 Unspecified abdominal pain: Secondary | ICD-10-CM | POA: Diagnosis present

## 2015-12-06 DIAGNOSIS — N23 Unspecified renal colic: Secondary | ICD-10-CM

## 2015-12-06 DIAGNOSIS — N200 Calculus of kidney: Secondary | ICD-10-CM

## 2015-12-06 LAB — URINALYSIS, ROUTINE W REFLEX MICROSCOPIC
BILIRUBIN URINE: NEGATIVE
Glucose, UA: NEGATIVE mg/dL
KETONES UR: NEGATIVE mg/dL
LEUKOCYTES UA: NEGATIVE
NITRITE: NEGATIVE
PH: 6 (ref 5.0–8.0)
SPECIFIC GRAVITY, URINE: 1.025 (ref 1.005–1.030)

## 2015-12-06 LAB — I-STAT CHEM 8, ED
BUN: 18 mg/dL (ref 6–20)
CREATININE: 1.1 mg/dL (ref 0.61–1.24)
Calcium, Ion: 1.17 mmol/L (ref 1.15–1.40)
Chloride: 105 mmol/L (ref 101–111)
Glucose, Bld: 118 mg/dL — ABNORMAL HIGH (ref 65–99)
HEMATOCRIT: 43 % (ref 39.0–52.0)
HEMOGLOBIN: 14.6 g/dL (ref 13.0–17.0)
POTASSIUM: 3.8 mmol/L (ref 3.5–5.1)
SODIUM: 141 mmol/L (ref 135–145)
TCO2: 25 mmol/L (ref 0–100)

## 2015-12-06 LAB — URINE MICROSCOPIC-ADD ON

## 2015-12-06 MED ORDER — ONDANSETRON HCL 4 MG PO TABS: 4.0000 mg | ORAL_TABLET | Freq: Three times a day (TID) | ORAL | 0 refills | Status: DC | PRN

## 2015-12-06 MED ORDER — FENTANYL CITRATE (PF) 100 MCG/2ML IJ SOLN
50.0000 ug | INTRAMUSCULAR | Status: AC | PRN
  Administered 2015-12-06 (×2): 50 ug via INTRAVENOUS
  Filled 2015-12-06 (×2): qty 2

## 2015-12-06 MED ORDER — ONDANSETRON HCL 4 MG/2ML IJ SOLN
4.0000 mg | INTRAMUSCULAR | Status: DC | PRN
  Administered 2015-12-06: 4 mg via INTRAVENOUS
  Filled 2015-12-06: qty 2

## 2015-12-06 MED ORDER — KETOROLAC TROMETHAMINE 30 MG/ML IJ SOLN
30.0000 mg | Freq: Once | INTRAMUSCULAR | Status: AC
  Administered 2015-12-06: 30 mg via INTRAVENOUS
  Filled 2015-12-06: qty 1

## 2015-12-06 MED ORDER — NAPROXEN 250 MG PO TABS: 250.0000 mg | ORAL_TABLET | Freq: Two times a day (BID) | ORAL | 0 refills | Status: DC

## 2015-12-06 MED ORDER — OXYCODONE-ACETAMINOPHEN 5-325 MG PO TABS: ORAL_TABLET | ORAL | 0 refills | Status: DC

## 2015-12-06 MED ORDER — OXYCODONE-ACETAMINOPHEN 5-325 MG PO TABS
2.0000 | ORAL_TABLET | Freq: Once | ORAL | Status: AC
  Administered 2015-12-06: 2 via ORAL
  Filled 2015-12-06: qty 2

## 2015-12-06 NOTE — ED Triage Notes (Signed)
Pt reports left flank pain x 2 weeks, worse since last night.  Reports hematuria that started yesterday.  Pt appears pale.

## 2015-12-06 NOTE — Discharge Instructions (Signed)
Take the prescriptions as directed. Call the Urologist on Monday to schedule a follow up appointment this week.  Return to the Emergency Department immediately if worsening. ° °

## 2015-12-06 NOTE — ED Notes (Signed)
MD at bedside. 

## 2015-12-06 NOTE — ED Provider Notes (Signed)
[facility] Provider Note   CSN: [id number] Arrival date & time: 12/06/15  0732     History   Chief Complaint Chief Complaint  Patient presents with  . Flank Pain    HPI Scharlene CornLehman J Geralds is a 25 y.o. male.   Flank Pain   Pt was seen at 0745. Per pt, c/o sudden onset and persistence of waxing and waning left sided flank "pain" for the past 2 weeks, worse since last night. Describes the pain as "constant," with radiation into his left abd. Has been associated with hematuria. Denies testicular pain/swelling, no dysuria, no abd pain, no N/V, no diarrhea, no black or blood in stools, no CP/SOB, no fevers, no rash.    History reviewed. No pertinent past medical history.  Patient Active Problem List   Diagnosis Date Noted  . Fx boxers 08/23/2012  . Weakness of right arm 08/23/2012  . Lack of coordination 08/23/2012  . LLQ abdominal pain 07/06/2012    Past Surgical History:  Procedure Laterality Date  . Hand fracture surgery       Home Medications    Prior to Admission medications   Not on File    Family History Family History  Problem Relation Age of Onset  . Arthritis Mother     Social History Social History  Substance Use Topics  . Smoking status: Never Smoker  . Smokeless tobacco: Never Used  . Alcohol use Yes     Comment: occasional     Allergies   Penicillins and Amoxicillin   Review of Systems Review of Systems  Genitourinary: Positive for flank pain.  ROS: Statement: All systems negative except as marked or noted in the HPI; Constitutional: Negative for fever and chills. ; ; Eyes: Negative for eye pain, redness and discharge. ; ; ENMT: Negative for ear pain, hoarseness, nasal congestion, sinus pressure and sore throat. ; ; Cardiovascular: Negative for chest pain, palpitations, diaphoresis, dyspnea and peripheral edema. ; ; Respiratory: Negative for cough, wheezing and stridor. ; ; Gastrointestinal: Negative for nausea, vomiting, diarrhea,  abdominal pain, blood in stool, hematemesis, jaundice and rectal bleeding. . ; ; Genitourinary: Negative for dysuria. +flank pain and hematuria. ; ; Genital:  No penile drainage or rash, no testicular pain or swelling, no scrotal rash or swelling. ;; Musculoskeletal: Negative for neck pain. Negative for swelling and trauma.; ; Skin: Negative for pruritus, rash, abrasions, blisters, bruising and skin lesion.; ; Neuro: Negative for headache, lightheadedness and neck stiffness. Negative for weakness, altered level of consciousness, altered mental status, extremity weakness, paresthesias, involuntary movement, seizure and syncope.      Physical Exam Updated Vital Signs BP 127/94 (BP Location: Left Arm)   Pulse 66   Temp 97.6 F (36.4 C) (Oral)   Resp 20   Ht 6\' 2"  (1.88 m)   Wt 220 lb (99.8 kg)   SpO2 96%   BMI 28.25 kg/m   Physical Exam 0750: Physical examination:  Nursing notes reviewed; Vital signs and O2 SAT reviewed;  Constitutional: Well developed, Well nourished, Well hydrated, Uncomfortable appearing, crying out.; Head:  Normocephalic, atraumatic; Eyes: EOMI, PERRL, No scleral icterus; ENMT: Mouth and pharynx normal, Mucous membranes moist; Neck: Supple, Full range of motion, No lymphadenopathy; Cardiovascular: Regular rate and rhythm, No gallop; Respiratory: Breath sounds clear & equal bilaterally, No wheezes.  Speaking full sentences with ease, Normal respiratory effort/excursion; Chest: Nontender, Movement normal; Abdomen: Soft, Nontender, Nondistended, Normal bowel sounds; Genitourinary: No CVA tenderness; Spine:  No midline CS, TS, LS tenderness. +  TTP left lumbar paraspinal muscles.;; Extremities: Pulses normal, No tenderness, No edema, No calf edema or asymmetry.; Neuro: AA&Ox3, Major CN grossly intact.  Speech clear. No gross focal motor or sensory deficits in extremities.; Skin: Color normal, Warm, Dry.    ED Treatments / Results  Labs (all labs ordered are listed, but only  abnormal results are displayed)   EKG  EKG Interpretation None       Radiology   Procedures Procedures (including critical care time)  Medications Ordered in ED Medications  fentaNYL (SUBLIMAZE) injection 50 mcg (not administered)  ondansetron (ZOFRAN) injection 4 mg (not administered)  ketorolac (TORADOL) 30 MG/ML injection 30 mg (not administered)     Initial Impression / Assessment and Plan / ED Course  I have reviewed the triage vital signs and the nursing notes.  Pertinent labs & imaging results that were available during my care of the patient were reviewed by me and considered in my medical decision making (see chart for details).  MDM Reviewed: previous chart, nursing note and vitals Interpretation: labs and CT scan    Results for orders placed or performed during the hospital encounter of 12/06/15  Urinalysis, Routine w reflex microscopic  Result Value Ref Range   Color, Urine YELLOW YELLOW   APPearance HAZY (A) CLEAR   Specific Gravity, Urine 1.025 1.005 - 1.030   pH 6.0 5.0 - 8.0   Glucose, UA NEGATIVE NEGATIVE mg/dL   Hgb urine dipstick LARGE (A) NEGATIVE   Bilirubin Urine NEGATIVE NEGATIVE   Ketones, ur NEGATIVE NEGATIVE mg/dL   Protein, ur TRACE (A) NEGATIVE mg/dL   Nitrite NEGATIVE NEGATIVE   Leukocytes, UA NEGATIVE NEGATIVE  Urine microscopic-add on  Result Value Ref Range   Squamous Epithelial / LPF 0-5 (A) NONE SEEN   WBC, UA 0-5 0 - 5 WBC/hpf   RBC / HPF TOO NUMEROUS TO COUNT 0 - 5 RBC/hpf   Bacteria, UA MANY (A) NONE SEEN   Urine-Other MUCOUS PRESENT   I-stat Chem 8, ED  Result Value Ref Range   Sodium 141 135 - 145 mmol/L   Potassium 3.8 3.5 - 5.1 mmol/L   Chloride 105 101 - 111 mmol/L   BUN 18 6 - 20 mg/dL   Creatinine, Ser 2.72 0.61 - 1.24 mg/dL   Glucose, Bld 536 (H) 65 - 99 mg/dL   Calcium, Ion 6.44 0.34 - 1.40 mmol/L   TCO2 25 0 - 100 mmol/L   Hemoglobin 14.6 13.0 - 17.0 g/dL   HCT 74.2 59.5 - 63.8 %   Ct Renal Stone  Study Result Date: 12/06/2015 CLINICAL DATA:  Left flank pain for 2 weeks with recent worsening. Hematuria. EXAM: CT ABDOMEN AND PELVIS WITHOUT CONTRAST TECHNIQUE: Multidetector CT imaging of the abdomen and pelvis was performed following the standard protocol without IV contrast. COMPARISON:  05/14/2011 CT abdomen/pelvis. FINDINGS: Lower chest: No significant pulmonary nodules or acute consolidative airspace disease. Hepatobiliary: Normal liver with no liver mass. Normal gallbladder with no radiopaque cholelithiasis. No biliary ductal dilatation. Pancreas: Normal, with no mass or duct dilation. Spleen: Normal size. No mass. Adrenals/Urinary Tract: Normal adrenals. Left renal pelvis 10 x 5 mm stone. Mild fullness of the left renal collecting system without overt hydronephrosis. No additional renal stones. No right hydronephrosis. No perinephric fat stranding. No contour deforming renal mass. Normal caliber ureters, with no ureteral stones. Collapsed and grossly normal bladder with no bladder stones. Stomach/Bowel: Grossly normal stomach. Normal caliber small bowel with no small bowel wall thickening. Normal appendix.  Normal large bowel with no diverticulosis, large bowel wall thickening or pericolonic fat stranding. Vascular/Lymphatic: Normal caliber abdominal aorta. No pathologically enlarged lymph nodes in the abdomen or pelvis. Reproductive: Normal size prostate. Other: No pneumoperitoneum, ascites or focal fluid collection. Asymmetric fat in the left inguinal canal may indicate a tiny fat containing left inguinal hernia. Musculoskeletal: No aggressive appearing focal osseous lesions. IMPRESSION: 1. Left renal pelvis 10 x 5 mm stone. Mild fullness of the left renal collecting system without overt left hydronephrosis. No perinephric fat stranding. No additional urolithiasis. 2. Possible tiny fat containing left inguinal hernia. Electronically Signed   By: Delbert PhenixJason A Poff M.D.   On: 12/06/2015 08:37    1215:  Pt  appears more comfortable after meds. No UTI and labs reassuring. T/C to Uro Dr. Berneice HeinrichManny, case discussed, including:  HPI, pertinent PM/SHx, VS/PE, dx testing, ED course and treatment:  Explain to pt his symptoms have likely been waxing and waning similar to ball-valve effect, rx pain meds and nausea meds (no flomax), pt will need ofc f/u, have pt call ofc Monday and ofc will also try to get ahold of pt. Dx and testing, as well as d/w Uro MD, d/w pt and family.  Questions answered.  Verb understanding, agreeable to d/c home with outpt f/u. Will medicate for pain before discharge and pt is able to get to pharmacy to fill rx.      Final Clinical Impressions(s) / ED Diagnoses   Final diagnoses:  Flank pain    New Prescriptions New Prescriptions   No medications on file      Samuel JesterKathleen Chaquita Basques, DO 12/08/15 2049

## 2015-12-08 LAB — URINE CULTURE: Culture: <10000 — AB

## 2015-12-09 ENCOUNTER — Other Ambulatory Visit: Payer: Self-pay | Admitting: Urology

## 2015-12-09 ENCOUNTER — Encounter (HOSPITAL_COMMUNITY): Payer: Self-pay | Admitting: *Deleted

## 2015-12-09 NOTE — Progress Notes (Signed)
Please place orders in EPIC as patient is being scheduled for a pre-op appointment with the nurse at [facility] for surgery on 12/11/2015! Thank you! 

## 2015-12-10 ENCOUNTER — Encounter (HOSPITAL_COMMUNITY): Payer: Self-pay | Admitting: Anesthesiology

## 2015-12-10 NOTE — Anesthesia Preprocedure Evaluation (Addendum)
Anesthesia Evaluation  Patient identified by MRN, date of birth, ID band Patient awake    Reviewed: Allergy & Precautions, NPO status , Patient's Chart, lab work & pertinent test results  Airway Mallampati: II  TM Distance: >3 FB Neck ROM: Full    Dental no notable dental hx.    Pulmonary neg pulmonary ROS,    Pulmonary exam normal breath sounds clear to auscultation       Cardiovascular negative cardio ROS Normal cardiovascular exam Rhythm:Regular Rate:Normal     Neuro/Psych negative neurological ROS  negative psych ROS   GI/Hepatic negative GI ROS, Neg liver ROS,   Endo/Other  negative endocrine ROS  Renal/GU negative Renal ROS  negative genitourinary   Musculoskeletal negative musculoskeletal ROS (+)   Abdominal   Peds negative pediatric ROS (+)  Hematology negative hematology ROS (+)   Anesthesia Other Findings   Reproductive/Obstetrics negative OB ROS                             Anesthesia Physical Anesthesia Plan  ASA: II  Anesthesia Plan: General   Post-op Pain Management:    Induction: Intravenous  Airway Management Planned: LMA  Additional Equipment:   Intra-op Plan:   Post-operative Plan: Extubation in OR  Informed Consent: I have reviewed the patients History and Physical, chart, labs and discussed the procedure including the risks, benefits and alternatives for the proposed anesthesia with the patient or authorized representative who has indicated his/her understanding and acceptance.   Dental advisory given  Plan Discussed with: CRNA  Anesthesia Plan Comments:         Anesthesia Quick Evaluation  

## 2015-12-11 ENCOUNTER — Ambulatory Visit (HOSPITAL_COMMUNITY): Payer: Medicaid Other | Admitting: Anesthesiology

## 2015-12-11 ENCOUNTER — Encounter (HOSPITAL_COMMUNITY)
Admission: RE | Disposition: A | Discharge status: Home / Self Care | Payer: Self-pay | Source: Ambulatory Visit | Attending: Urology

## 2015-12-11 ENCOUNTER — Ambulatory Visit (HOSPITAL_COMMUNITY)
Admission: RE | Admit: 2015-12-11 | Discharge: 2015-12-11 | Disposition: A | Discharge status: Home / Self Care | Payer: Medicaid Other | Source: Ambulatory Visit | Attending: Urology | Admitting: Urology

## 2015-12-11 ENCOUNTER — Encounter (HOSPITAL_COMMUNITY): Payer: Self-pay | Admitting: *Deleted

## 2015-12-11 DIAGNOSIS — Z880 Allergy status to penicillin: Secondary | ICD-10-CM | POA: Diagnosis not present

## 2015-12-11 DIAGNOSIS — N200 Calculus of kidney: Secondary | ICD-10-CM

## 2015-12-11 DIAGNOSIS — N202 Calculus of kidney with calculus of ureter: Secondary | ICD-10-CM | POA: Insufficient documentation

## 2015-12-11 HISTORY — PX: CYSTOSCOPY WITH HOLMIUM LASER LITHOTRIPSY: SHX6639

## 2015-12-11 HISTORY — DX: Personal history of urinary calculi: Z87.442

## 2015-12-11 SURGERY — CYSTOSCOPY, WITH HOLMIUM LASER LITHOTRIPSY
Anesthesia: General | Laterality: Left

## 2015-12-11 MED ORDER — OXYCODONE HCL 5 MG PO TABS
ORAL_TABLET | ORAL | Status: AC
  Administered 2015-12-11: 5 mg via ORAL
  Filled 2015-12-11: qty 1

## 2015-12-11 MED ORDER — LIDOCAINE 2% (20 MG/ML) 5 ML SYRINGE
INTRAMUSCULAR | Status: AC
  Filled 2015-12-11: qty 5

## 2015-12-11 MED ORDER — ONDANSETRON HCL 4 MG/2ML IJ SOLN
INTRAMUSCULAR | Status: DC | PRN
  Administered 2015-12-11: 4 mg via INTRAVENOUS

## 2015-12-11 MED ORDER — DEXAMETHASONE SODIUM PHOSPHATE 10 MG/ML IJ SOLN
INTRAMUSCULAR | Status: AC
  Filled 2015-12-11: qty 1

## 2015-12-11 MED ORDER — MIDAZOLAM HCL 5 MG/5ML IJ SOLN
INTRAMUSCULAR | Status: DC | PRN
  Administered 2015-12-11: 2 mg via INTRAVENOUS

## 2015-12-11 MED ORDER — PROPOFOL 10 MG/ML IV BOLUS
INTRAVENOUS | Status: AC
  Filled 2015-12-11: qty 40

## 2015-12-11 MED ORDER — LACTATED RINGERS IV SOLN: INTRAVENOUS | Status: DC

## 2015-12-11 MED ORDER — CIPROFLOXACIN IN D5W 400 MG/200ML IV SOLN
400.0000 mg | INTRAVENOUS | Status: AC
  Administered 2015-12-11: 400 mg via INTRAVENOUS

## 2015-12-11 MED ORDER — LIDOCAINE 2% (20 MG/ML) 5 ML SYRINGE
INTRAMUSCULAR | Status: DC | PRN
  Administered 2015-12-11: 100 mg via INTRAVENOUS

## 2015-12-11 MED ORDER — LIDOCAINE HCL 2 % EX GEL
CUTANEOUS | Status: AC
Start: 2015-12-11 — End: 2015-12-11
  Filled 2015-12-11: qty 5

## 2015-12-11 MED ORDER — FENTANYL CITRATE (PF) 100 MCG/2ML IJ SOLN
INTRAMUSCULAR | Status: AC
  Administered 2015-12-11: 25 ug via INTRAVENOUS
  Filled 2015-12-11: qty 2

## 2015-12-11 MED ORDER — DEXAMETHASONE SODIUM PHOSPHATE 10 MG/ML IJ SOLN
INTRAMUSCULAR | Status: DC | PRN
  Administered 2015-12-11: 10 mg via INTRAVENOUS

## 2015-12-11 MED ORDER — FENTANYL CITRATE (PF) 100 MCG/2ML IJ SOLN
INTRAMUSCULAR | Status: AC
  Filled 2015-12-11: qty 2

## 2015-12-11 MED ORDER — CIPROFLOXACIN IN D5W 400 MG/200ML IV SOLN
INTRAVENOUS | Status: AC
  Filled 2015-12-11: qty 200

## 2015-12-11 MED ORDER — PROPOFOL 10 MG/ML IV BOLUS
INTRAVENOUS | Status: DC | PRN
  Administered 2015-12-11: 250 mg via INTRAVENOUS

## 2015-12-11 MED ORDER — KETOROLAC TROMETHAMINE 30 MG/ML IJ SOLN
30.0000 mg | Freq: Once | INTRAMUSCULAR | Status: AC
  Administered 2015-12-11: 30 mg via INTRAVENOUS

## 2015-12-11 MED ORDER — METOCLOPRAMIDE HCL 5 MG/ML IJ SOLN: 10.0000 mg | Freq: Once | INTRAMUSCULAR | Status: DC | PRN

## 2015-12-11 MED ORDER — BELLADONNA ALKALOIDS-OPIUM 16.2-60 MG RE SUPP
RECTAL | Status: AC
  Filled 2015-12-11: qty 1

## 2015-12-11 MED ORDER — FENTANYL CITRATE (PF) 100 MCG/2ML IJ SOLN: 25.0000 ug | INTRAMUSCULAR | Status: DC | PRN

## 2015-12-11 MED ORDER — OXYCODONE HCL 5 MG/5ML PO SOLN: 5.0000 mg | Freq: Once | ORAL | Status: AC | PRN

## 2015-12-11 MED ORDER — LACTATED RINGERS IV SOLN
INTRAVENOUS | Status: DC
  Administered 2015-12-11: 10:00:00 via INTRAVENOUS

## 2015-12-11 MED ORDER — ONDANSETRON HCL 4 MG/2ML IJ SOLN
INTRAMUSCULAR | Status: AC
  Filled 2015-12-11: qty 2

## 2015-12-11 MED ORDER — FENTANYL CITRATE (PF) 100 MCG/2ML IJ SOLN
25.0000 ug | INTRAMUSCULAR | Status: DC | PRN
  Administered 2015-12-11: 25 ug via INTRAVENOUS
  Administered 2015-12-11: 50 ug via INTRAVENOUS

## 2015-12-11 MED ORDER — MEPERIDINE HCL 50 MG/ML IJ SOLN: 6.2500 mg | INTRAMUSCULAR | Status: DC | PRN

## 2015-12-11 MED ORDER — SODIUM CHLORIDE 0.9 % IR SOLN
Status: DC | PRN
Start: 2015-12-11 — End: 2015-12-11
  Administered 2015-12-11: 6000 mL

## 2015-12-11 MED ORDER — ACETAMINOPHEN 10 MG/ML IV SOLN
INTRAVENOUS | Status: AC
  Administered 2015-12-11: 1000 mg via INTRAVENOUS
  Filled 2015-12-11: qty 100

## 2015-12-11 MED ORDER — ACETAMINOPHEN 10 MG/ML IV SOLN
1000.0000 mg | Freq: Once | INTRAVENOUS | Status: AC
  Administered 2015-12-11: 1000 mg via INTRAVENOUS

## 2015-12-11 MED ORDER — MIDAZOLAM HCL 2 MG/2ML IJ SOLN
INTRAMUSCULAR | Status: AC
  Filled 2015-12-11: qty 2

## 2015-12-11 MED ORDER — FENTANYL CITRATE (PF) 100 MCG/2ML IJ SOLN
INTRAMUSCULAR | Status: DC | PRN
  Administered 2015-12-11 (×6): 50 ug via INTRAVENOUS

## 2015-12-11 MED ORDER — PHENAZOPYRIDINE HCL 200 MG PO TABS: 200.0000 mg | ORAL_TABLET | Freq: Three times a day (TID) | ORAL | 0 refills | Status: DC | PRN

## 2015-12-11 MED ORDER — KETOROLAC TROMETHAMINE 30 MG/ML IJ SOLN
INTRAMUSCULAR | Status: AC
Start: 2015-12-11 — End: 2015-12-11
  Administered 2015-12-11: 30 mg via INTRAVENOUS
  Filled 2015-12-11: qty 1

## 2015-12-11 MED ORDER — OXYCODONE HCL 5 MG PO TABS
5.0000 mg | ORAL_TABLET | Freq: Once | ORAL | Status: AC | PRN
  Administered 2015-12-11: 5 mg via ORAL

## 2015-12-11 MED ORDER — BELLADONNA ALKALOIDS-OPIUM 16.2-60 MG RE SUPP
RECTAL | Status: DC | PRN
  Administered 2015-12-11: 1 via RECTAL

## 2015-12-11 MED ORDER — LIDOCAINE HCL 2 % EX GEL
CUTANEOUS | Status: DC | PRN
  Administered 2015-12-11: 1 via URETHRAL

## 2015-12-11 MED ORDER — LACTATED RINGERS IV SOLN
INTRAVENOUS | Status: DC | PRN
  Administered 2015-12-11 (×2): via INTRAVENOUS

## 2015-12-11 MED ORDER — TRAMADOL HCL 50 MG PO TABS: 50.0000 mg | ORAL_TABLET | Freq: Four times a day (QID) | ORAL | 0 refills | Status: DC | PRN

## 2015-12-11 MED ORDER — PROMETHAZINE HCL 25 MG/ML IJ SOLN: 6.2500 mg | INTRAMUSCULAR | Status: DC | PRN

## 2015-12-11 SURGICAL SUPPLY — 23 items
BAG URO CATCHER STRL LF (MISCELLANEOUS) ×3 IMPLANT
BASKET DAKOTA 1.9FR 11X120 (BASKET) IMPLANT
BASKET ZERO TIP NITINOL 2.4FR (BASKET) IMPLANT
BSKT STON RTRVL ZERO TP 2.4FR (BASKET)
CATH URET 5FR 28IN OPEN ENDED (CATHETERS) ×3 IMPLANT
CATH URET DUAL LUMEN 6-10FR 50 (CATHETERS) ×2 IMPLANT
CLOTH BEACON ORANGE TIMEOUT ST (SAFETY) ×3 IMPLANT
EXTRACTOR STONE NITINOL NGAGE (UROLOGICAL SUPPLIES) ×2 IMPLANT
FIBER LASER TRAC TIP (UROLOGICAL SUPPLIES) ×2 IMPLANT
GLOVE BIOGEL M STRL SZ7.5 (GLOVE) ×3 IMPLANT
GOWN STRL REUS W/TWL XL LVL3 (GOWN DISPOSABLE) ×3 IMPLANT
GUIDEWIRE ANG ZIPWIRE 038X150 (WIRE) IMPLANT
GUIDEWIRE STR DUAL SENSOR (WIRE) ×5 IMPLANT
KIT BALLIN UROMAX 15FX10 (LABEL) IMPLANT
MANIFOLD NEPTUNE II (INSTRUMENTS) ×3 IMPLANT
PACK CYSTO (CUSTOM PROCEDURE TRAY) ×3 IMPLANT
SET HIGH PRES BAL DIL (LABEL) ×2
SHEATH ACCESS URETERAL 24CM (SHEATH) IMPLANT
SHEATH ACCESS URETERAL 38CM (SHEATH) ×2 IMPLANT
SHEATH ACCESS URETERAL 54CM (SHEATH) IMPLANT
STENT CONTOUR 6FRX28X.038 (STENTS) ×2 IMPLANT
TUBING CONNECTING 10 (TUBING) ×2 IMPLANT
TUBING CONNECTING 10' (TUBING) ×1

## 2015-12-11 NOTE — Transfer of Care (Signed)
Immediate Anesthesia Transfer of Care Note  Patient: Tony Turner  Procedure(s) Performed: Procedure(s): CYSTOSCOPY, ureterscopy, WITH HOLMIUM LASER LITHOTRIPSY and stone removal, left retrograde pyleogram and left stent placement (Left)  Patient Location: PACU  Anesthesia Type:General  Level of Consciousness: sedated  Airway & Oxygen Therapy: Patient Spontanous Breathing and Patient connected to face mask oxygen  Post-op Assessment: Report given to RN and Post -op Vital signs reviewed and stable  Post vital signs: Reviewed and stable  Last Vitals:  Vitals:   12/11/15 0601 12/11/15 1002  BP: 136/90   Pulse: (!) 111   Resp: 16   Temp: 36.7 C 36.6 C    Last Pain:  Vitals:   12/11/15 0601  TempSrc: Oral      Patients Stated Pain Goal: 4 (12/11/15 0610)  Complications: No apparent anesthesia complications

## 2015-12-11 NOTE — Anesthesia Postprocedure Evaluation (Signed)
Anesthesia Post Note  Patient: Scharlene CornLehman J Melder  Procedure(s) Performed: Procedure(s) (LRB): CYSTOSCOPY, ureterscopy, WITH HOLMIUM LASER LITHOTRIPSY and stone removal, left retrograde pyleogram and left stent placement (Left)  Patient location during evaluation: PACU Anesthesia Type: General Level of consciousness: awake and alert Pain management: pain level controlled Vital Signs Assessment: post-procedure vital signs reviewed and stable Respiratory status: spontaneous breathing, nonlabored ventilation, respiratory function stable and patient connected to nasal cannula oxygen Cardiovascular status: blood pressure returned to baseline and stable Postop Assessment: no signs of nausea or vomiting Anesthetic complications: no    Last Vitals:  Vitals:   12/11/15 1200 12/11/15 1245  BP: (!) 141/90 (!) 139/95  Pulse: (!) 104 (!) 105  Resp: 15 16  Temp: 37 C 36.6 C    Last Pain:  Vitals:   12/11/15 1245  TempSrc: Oral  PainSc: 2                  Phillips Groutarignan, Shuntia Exton

## 2015-12-11 NOTE — H&P (Signed)
Acute Kidney Stone HPI: Tony Turner who is here for further eval and management of kidney stones.  He was diagnosed with a kidney stone on approximately 12/07/2015. The Turner presented to [facility] with symptoms of a kidney stone.   His pain started about approximately 11/25/2015. The pain is on the left side.   Abdomen/Pelvic CT: 10/30 - Left renal pelvic stone 10x325mm. The Turner underwent No imaging prior to today's appointment.   The Turner relates initially having nausea, vomiting, and flank pain. He is currently having flank pain, back pain, and nausea. He denies having groin pain, vomiting, fever, chills, and voiding symptoms. He has been taking Percocet and zofran. He has not caught a stone in his urine strainer since his symptoms began.   He has never had surgical treatment for calculi in the past. This is his first kidney stone.   The Turner has had some reprieve of his symptoms since seen in the ER although he continues to have severe pain intermittently. Currently, the Turner is in severe pain.    ALLERGIES: Amoxicillin Penicillins   MEDICATIONS: Oxycodone Hcl  Zofran 4 mg tablet    GU PSH: No GU PSH     PSH Notes: Right hand-metal plate and screws- 2013 and 2014  NON-GU PSH: No Non-GU PSH      GU PMH: No GU PMH     PMH Notes: Stomach ulcers  NON-GU PMH: No Non-GU PMH   FAMILY HISTORY: 1 Daughter - Daughter Kidney Stones - Mother  SOCIAL HISTORY: Marital Status: Single Current Smoking Status: Turner has never smoked.  Has never drank.  Drinks 4+ caffeinated drinks per day.   REVIEW OF SYSTEMS:    GU Review Male:  Turner reports burning/ pain with urination, hard to postpone urination, stream starts and stops, and get up at night to urinate. Turner denies penile pain, have to strain to urinate , trouble starting your stream, erection problems, leakage of urine, and frequent urination.   Gastrointestinal (Upper):  Turner  reports nausea and vomiting. Turner denies indigestion/ heartburn.   Gastrointestinal (Lower):  Turner reports constipation. Turner denies diarrhea.   Constitutional:  Turner denies fever, night sweats, weight loss, and fatigue.   Skin:  Turner denies skin rash/ lesion and itching.   Eyes:  Turner denies blurred vision and double vision.   Ears/ Nose/ Throat:  Turner denies sore throat and sinus problems.   Hematologic/Lymphatic:  Turner denies swollen glands and easy bruising.   Cardiovascular:  Turner denies leg swelling and chest pains.   Respiratory:  Turner denies cough and shortness of breath.   Endocrine:  Turner denies excessive thirst.   Musculoskeletal:  Turner reports back pain. Turner denies joint pain.   Neurological:  Turner denies headaches and dizziness.   Psychologic:  Turner denies depression and anxiety.   VITAL SIGNS:      12/09/2015 11:10 AM    Weight 225 lb / 102.06 kg    Height 71 in / 180.34 cm    BP 112/80 mmHg    Pulse 84 /min    BMI 31.4 kg/m    MULTI-SYSTEM PHYSICAL EXAMINATION:     Constitutional: Well-nourished. No physical deformities. Normally developed. Good grooming.    Neck: Neck symmetrical, not swollen. Normal tracheal position.    Respiratory: No labored breathing, no use of accessory muscles.     Cardiovascular: Normal temperature, normal extremity pulses, no swelling, no varicosities.    Lymphatic: No enlargement of  neck, axillae, groin.    Skin: No paleness, no jaundice, no cyanosis. No lesion, no ulcer, no rash.    Neurologic / Psychiatric: Oriented to time, oriented to place, oriented to person. No depression, no anxiety, no agitation.    Gastrointestinal: No mass, no tenderness, no rigidity, non obese abdomen.    Eyes: Normal conjunctivae. Normal eyelids.    Ears, Nose, Mouth, and Throat: Left ear no scars, no lesions, no masses. Right ear no scars, no lesions, no masses. Nose no scars, no lesions, no masses. Normal hearing.  Normal lips.    Musculoskeletal: Normal gait and station of head and neck.          PAST DATA REVIEWED:  Source Of History:  Turner Records Review:  Previous Turner Records X-Ray Review: C.T. Abdomen/Pelvis: Reviewed Films.    PROCEDURES:   Ketoralac 60mg  - [id number], [id number]  Qty: 60 Adm. By: Lyndal RainbowAshley Lewis Unit: mg Lot No [id number] Route: IM Exp. Date 02/08/2017 Freq: None Mfgr.:  Site: Left Buttock  ASSESSMENT:    ICD-10 Details 1 GU:  Calculus Kidney and Ureter - N20.2 Left, Acute - The Turner has a left 5 mm 10 mm UPJ stone that appears to be ball valving and causing intermittent obstruction and associated severe pain.  PLAN:  Medications  New Meds: Oxycodone Hcl 5 mg tablet 1-2 tablet PO Q 6 H #20 0 Refill(s)  Zofran 4 mg tablet 1 tablet PO Q 4 H #20 1 Refill(s)   Document  Letter(s):  Created for Turner: Clinical Summary I went over the treatment options for their stone. We discussed ongoing medical expulsion therapy, ESWL and ureteroscopy. Ultimately the Turner and I agreed that ureterscopy is the best option. I went over this surgery with the Turner in detail. The Turner understands after being put to sleep, we would proceed with a telescope to access the stone and potentially use a laser to fragment the stone before removing it with a basket. After removing the stone the Turner will require temporary stent placement in the ureter. This is an outpatient procedure. I also discussed the potential of not being able to gain access safely into the ureter/kidney. This would require that a stent be placed and then the Turner rescheduled several weeks later for a second attempt. They also understand the small risks of ureteral trauma causing a stricture or permanent damage. I also explained the risk of urinary tract infection. Having gone over the procedure itself, the expected outcome, and the risks/benefits the Turner has agreed to proceed.

## 2015-12-11 NOTE — Discharge Instructions (Signed)
General Anesthesia, Adult, Care After Refer to this sheet in the next few weeks. These instructions provide you with information on caring for yourself after your procedure. Your health care provider may also give you more specific instructions. Your treatment has been planned according to current medical practices, but problems sometimes occur. Call your health care provider if you have any problems or questions after your procedure. WHAT TO EXPECT AFTER THE PROCEDURE After the procedure, it is typical to experience:  Sleepiness.  Nausea and vomiting. HOME CARE INSTRUCTIONS  For the first 24 hours after general anesthesia:  Have a responsible person with you.  Do not drive a car. If you are alone, do not take public transportation.  Do not drink alcohol.  Do not take medicine that has not been prescribed by your health care provider.  Do not sign important papers or make important decisions.  You may resume a normal diet and activities as directed by your health care provider.  Change bandages (dressings) as directed.  If you have questions or problems that seem related to general anesthesia, call the hospital and ask for the anesthetist or anesthesiologist on call. SEEK MEDICAL CARE IF:  You have nausea and vomiting that continue the day after anesthesia.  You develop a rash. SEEK IMMEDIATE MEDICAL CARE IF:   You have difficulty breathing.  You have chest pain.  You have any allergic problems.   This information is not intended to replace advice given to you by your health care provider. Make sure you discuss any questions you have with your health care provider.   Document Released: 05/03/2000 Document Revised: 02/15/2014 Document Reviewed: 05/26/2011 Elsevier Interactive Patient Education 2016 [employer]    DISCHARGE INSTRUCTIONS FOR KIDNEY STONE/URETERAL STENT   MEDICATIONS:  1. Resume all your other meds from home - except do not take any extra narcotic  pain meds that you may have at home.  2. Pyridium is to help with the burning/stinging when you urinate. 3. Tramadol is for moderate/severe pain, otherwise taking upto 1000 mg every 6 hours of plainTylenol will help treat your pain.     ACTIVITY:  1. No strenuous activity x 1week  2. No driving while on narcotic pain medications  3. Drink plenty of water  4. Continue to walk at home - you can still get blood clots when you are at home, so keep active, but don't over do it.  5. May return to work/school tomorrow or when you feel ready   BATHING:  1. You can shower and we recommend daily showers  2. You have a string coming from your urethra: The stent string is attached to your ureteral stent. Do not pull on this.   SIGNS/SYMPTOMS TO CALL:  Please call us if you have a fever greater than 101.5, uncontrolled nausea/vomiting, uncontrolled pain, dizziness, unable to urinate, bloody urine, chest pain, shortness of breath, leg swelling, leg pain, redness around wound, drainage from wound, or any other concerns or questions.   You can reach us at [phone].   FOLLOW-UP:  1.You have an appointment for stent removal in 1 week.

## 2015-12-11 NOTE — Op Note (Signed)
Preoperative diagnosis:  1. Left proximal ureteral stone   Postoperative diagnosis:  1. Same   Procedure: 1. Cystoscopy, left retrograde pyelogram with interpretation 2. Left ureteroscopy, laser lithotripsy, stone extraction 3. Left ureteral stent placement  Surgeon: Crist FatBenjamin W. Renee Beale, MD  Anesthesia: General  Complications: None  Intraoperative findings:  #1: The patient had a very narrow mid/distal ureter making it very difficult to access the kidney. Ultimately, after dilating this area I was able to get the access sheath and at least through the intramural ureter and was able to get the patient stone free at the end of the case. #2: The left retrograde pyelogram was performed using 10 mL of Omnipaque contrast beginning of the case. A 5 French open-ended ureteral catheter cannulated the left ureteral orifice and retrograde pyelogram was normal. Particular, the ureter was normal caliber with no filling defects. There was a filling defect noted in the patient's upper pole calyx consistent with the patient's stone, likely having been pushed back into the renal pelvis. There were no additional filling defects or notable hydronephrosis. The calyces were otherwise sharp.   EBL: Minimal  Specimens: None  Indication: Scharlene CornLehman J Halabi is a 25 y.o. patient with symptomatic left proximal ureteral stone.  After reviewing the management options for treatment, he elected to proceed with the above surgical procedure(s). We have discussed the potential benefits and risks of the procedure, side effects of the proposed treatment, the likelihood of the patient achieving the goals of the procedure, and any potential problems that might occur during the procedure or recuperation. Informed consent has been obtained.  Description of procedure:  The patient was taken to the operating room and general anesthesia was induced.  The patient was placed in the dorsal lithotomy position, prepped and draped in the  usual sterile fashion, and preoperative antibiotics were administered. A preoperative time-out was performed.   With a 21 French 30 cystoscope the scope which passed to the patient's urethra and into the bladder. Cystoscopic evaluation was then performed noting the ureters be in orthotopic position with no mucosal abnormalities. Using a 5 [place]French open-ended ureteral catheter then performed retrograde pyelogram with the above findings. I then advanced a 0.038 sensor wire through the open-ended catheter and into the left renal pelvis. I then emptied the bladder and remove the cystoscope. I then passed a 6/4 [place] short semirigid ureteroscope through the patient's ureteral orifice and into the mid left ureter with no abnormalities. I then advanced a second wire through the scope and gently passed the scope out of the ureter. I then advanced the inner portion of the 12/14 JamaicaFrench ureteral access sheath which I was unable to pass beyond the transmural ureter. I then removed the access sheath and exchanged for a 15 French 10 cm UroMax balloon dilator. I dilated the distal ureter and transmural ureter. I then again attempted to pass the inner portion of the access sheath unsuccessfully through the distal ureter. In the process of this I lost the wire and needed a dual-lumen catheter to replace the second wire. I then passed the flexible ureteroscope successfully through the ureter over the second wire and into the left renal pelvis. Pyeloscopy was then performed noting a large stone in the upper pole. There are no other stones. Using a 200  fiber with settings of point 6 J and 6 Hz I was able to fragment the stone into small stone fragments. I then using a engage basket and removed 1 large fragments. At this point I  opted to place the second wire again using the dual-lumen catheter and re-dilate the distal ureter. After dilating the ureter 2 places I was able to advance the 12/14 [place]French ureteral access sheath up to the  pelvic inlet. At this point I removed the inner portion of the access sheath and using the flexible ureteroscope basketed all the remaining stone fragments within the kidney. Once all stone fragments had been removed I injected more Omnipaque contrast and performed pyeloscopy under fluoroscopic guidance to ensure that there were no remaining stone fragments. There were several smaller stone fragments that I was unable to basket because of the size which were in the upper pole and left behind.  I then backed out the flexible ureteroscope simultaneously removing the ureteral access sheath to ensure that there was no significant ureteral trauma. The ureter was intact. I then reintroduced the cystoscope over the safety wire and advanced a 6 [place] times 28 cm double-J ureteral stent over the wire positioning in the upper pole using fluoroscopic guidance. Once the stent was noted to be well positioned in the kidney advance the stent to the bladder neck and then removed the wire noting a nice curl in the bladder. The bladder was subsequently emptied. A BNO suppository wasn't on the patient's rectum and lidocaine jelly into the patient's urethra. The patient was subsequently extubated and returned to PACU in stable condition.  Disposition: The patient will be scheduled for follow-up in one week to have the patient stent removed.     Crist FatBenjamin W. Contrina Orona, M.D.

## 2015-12-18 ENCOUNTER — Encounter (HOSPITAL_COMMUNITY): Payer: Self-pay | Admitting: Emergency Medicine

## 2015-12-18 ENCOUNTER — Encounter (HOSPITAL_COMMUNITY): Payer: Self-pay

## 2015-12-18 ENCOUNTER — Emergency Department (HOSPITAL_COMMUNITY)
Admission: EM | Admit: 2015-12-18 | Discharge: 2015-12-18 | Disposition: A | Discharge status: Home / Self Care | Payer: Medicaid Other | Attending: Emergency Medicine | Admitting: Emergency Medicine

## 2015-12-18 ENCOUNTER — Emergency Department (HOSPITAL_COMMUNITY)
Admission: EM | Admit: 2015-12-18 | Discharge: 2015-12-18 | Disposition: A | Discharge status: Home / Self Care | Payer: Medicaid Other | Source: Home / Self Care | Attending: Emergency Medicine | Admitting: Emergency Medicine

## 2015-12-18 DIAGNOSIS — N390 Urinary tract infection, site not specified: Secondary | ICD-10-CM

## 2015-12-18 DIAGNOSIS — R109 Unspecified abdominal pain: Secondary | ICD-10-CM | POA: Diagnosis present

## 2015-12-18 DIAGNOSIS — Z79899 Other long term (current) drug therapy: Secondary | ICD-10-CM | POA: Insufficient documentation

## 2015-12-18 LAB — BASIC METABOLIC PANEL
ANION GAP: 8 (ref 5–15)
BUN: 17 mg/dL (ref 6–20)
CHLORIDE: 108 mmol/L (ref 101–111)
CO2: 25 mmol/L (ref 22–32)
Calcium: 9.7 mg/dL (ref 8.9–10.3)
Creatinine, Ser: 1.28 mg/dL — ABNORMAL HIGH (ref 0.61–1.24)
Glucose, Bld: 108 mg/dL — ABNORMAL HIGH (ref 65–99)
POTASSIUM: 4.1 mmol/L (ref 3.5–5.1)
SODIUM: 141 mmol/L (ref 135–145)

## 2015-12-18 LAB — URINE MICROSCOPIC-ADD ON

## 2015-12-18 LAB — URINALYSIS, ROUTINE W REFLEX MICROSCOPIC
GLUCOSE, UA: NEGATIVE mg/dL
Glucose, UA: NEGATIVE mg/dL
Ketones, ur: 15 mg/dL — AB
Ketones, ur: NEGATIVE mg/dL
Nitrite: POSITIVE — AB
Nitrite: POSITIVE — AB
PH: 6.5 (ref 5.0–8.0)
PROTEIN: 100 mg/dL — AB
Protein, ur: 100 mg/dL — AB
SPECIFIC GRAVITY, URINE: 1.018 (ref 1.005–1.030)
SPECIFIC GRAVITY, URINE: 1.018 (ref 1.005–1.030)
pH: 7 (ref 5.0–8.0)

## 2015-12-18 LAB — CBC WITH DIFFERENTIAL/PLATELET
BASOS ABS: 0 10*3/uL (ref 0.0–0.1)
Basophils Relative: 0 %
EOS PCT: 1 %
Eosinophils Absolute: 0.1 10*3/uL (ref 0.0–0.7)
HCT: 43.3 % (ref 39.0–52.0)
HEMOGLOBIN: 15.7 g/dL (ref 13.0–17.0)
LYMPHS ABS: 1.3 10*3/uL (ref 0.7–4.0)
LYMPHS PCT: 9 %
MCH: 31.2 pg (ref 26.0–34.0)
MCHC: 36.3 g/dL — ABNORMAL HIGH (ref 30.0–36.0)
MCV: 85.9 fL (ref 78.0–100.0)
Monocytes Absolute: 1.2 10*3/uL — ABNORMAL HIGH (ref 0.1–1.0)
Monocytes Relative: 8 %
NEUTROS PCT: 82 %
Neutro Abs: 11.7 10*3/uL — ABNORMAL HIGH (ref 1.7–7.7)
PLATELETS: 242 10*3/uL (ref 150–400)
RBC: 5.04 MIL/uL (ref 4.22–5.81)
RDW: 13.1 % (ref 11.5–15.5)
WBC: 14.3 10*3/uL — AB (ref 4.0–10.5)

## 2015-12-18 MED ORDER — DIPHENHYDRAMINE HCL 50 MG/ML IJ SOLN
50.0000 mg | Freq: Once | INTRAMUSCULAR | Status: AC
  Administered 2015-12-18: 50 mg via INTRAVENOUS
  Filled 2015-12-18: qty 1

## 2015-12-18 MED ORDER — ONDANSETRON 4 MG PO TBDP: 4.0000 mg | ORAL_TABLET | Freq: Three times a day (TID) | ORAL | 0 refills | Status: DC | PRN

## 2015-12-18 MED ORDER — LEVOFLOXACIN IN D5W 500 MG/100ML IV SOLN
500.0000 mg | Freq: Once | INTRAVENOUS | Status: AC
  Administered 2015-12-18: 500 mg via INTRAVENOUS
  Filled 2015-12-18: qty 100

## 2015-12-18 MED ORDER — CIPROFLOXACIN HCL 500 MG PO TABS: 500.0000 mg | ORAL_TABLET | Freq: Two times a day (BID) | ORAL | 0 refills | Status: DC

## 2015-12-18 MED ORDER — TAMSULOSIN HCL 0.4 MG PO CAPS
0.4000 mg | ORAL_CAPSULE | Freq: Once | ORAL | Status: AC
  Administered 2015-12-18: 0.4 mg via ORAL
  Filled 2015-12-18: qty 1

## 2015-12-18 MED ORDER — OXYCODONE-ACETAMINOPHEN 5-325 MG PO TABS
1.0000 | ORAL_TABLET | ORAL | Status: DC | PRN
  Administered 2015-12-18: 1 via ORAL
  Filled 2015-12-18 (×2): qty 1

## 2015-12-18 MED ORDER — HYDROCODONE-ACETAMINOPHEN 5-325 MG PO TABS
2.0000 | ORAL_TABLET | Freq: Once | ORAL | Status: AC
  Administered 2015-12-18: 2 via ORAL
  Filled 2015-12-18: qty 2

## 2015-12-18 MED ORDER — MORPHINE SULFATE (PF) 4 MG/ML IV SOLN
4.0000 mg | Freq: Once | INTRAVENOUS | Status: AC
  Administered 2015-12-18: 4 mg via INTRAVENOUS
  Filled 2015-12-18: qty 1

## 2015-12-18 MED ORDER — TAMSULOSIN HCL 0.4 MG PO CAPS: 0.4000 mg | ORAL_CAPSULE | Freq: Every day | ORAL | 0 refills | Status: DC

## 2015-12-18 MED ORDER — KETOROLAC TROMETHAMINE 30 MG/ML IJ SOLN
30.0000 mg | Freq: Once | INTRAMUSCULAR | Status: AC
  Administered 2015-12-18: 30 mg via INTRAVENOUS
  Filled 2015-12-18: qty 1

## 2015-12-18 MED ORDER — ONDANSETRON HCL 4 MG/2ML IJ SOLN
4.0000 mg | Freq: Once | INTRAMUSCULAR | Status: AC
  Administered 2015-12-18: 4 mg via INTRAVENOUS
  Filled 2015-12-18: qty 2

## 2015-12-18 NOTE — ED Triage Notes (Signed)
Patient complaining of not being able to urinate since 6pm. Patient states he got the stent out about 4:30 from where he had a 10ml stone. Patient states he feels like he needs to urinate but cant.

## 2015-12-18 NOTE — Progress Notes (Signed)
Patient listed as having Medicaid insurance without a pcp.  EDCM spoke to patient at bedside.  [facility] on [facility]'s [facility] card [facility]  System updated.

## 2015-12-18 NOTE — Discharge Instructions (Signed)
Please call [facility] tomorrow morning to schedule follow up appointment with Dr. Marlou PorchHerrick. Also, take the Flomax daily for relief of ureteral spasms, Zofran as needed for nausea, and complete the full course of Cipro as prescribed to you this morning.

## 2015-12-18 NOTE — ED Triage Notes (Signed)
Pt had stent removed yesterday from a stone removal last week.  Pt started having pain and came in to hospital this morning. Dx with bladder infection.  Pt states pain continues and blood in urine continues.  Fever at home of 99.7 per family.

## 2015-12-18 NOTE — ED Provider Notes (Signed)
[facility]-EMERGENCY DEPT Provider Note   CSN: [id number] Arrival date & time: 12/18/15  0155  By signing my name below, I, Emmanuella Mensah, attest that this documentation has been prepared under the direction and in the presence of Zadie Rhineonald Zakariya Knickerbocker, MD. Electronically Signed: Angelene GiovanniEmmanuella Mensah, ED Scribe. 12/18/15. 2:41 AM.   History   Chief Complaint Chief Complaint  Patient presents with  . Urinary Retention    HPI Comments: Tony Turner is a 25 y.o. male with a hx of kidney stones who presents to the Emergency Department complaining of sudden onset, persistent moderate left flank pain onset yesterday. He reports associated nausea and urinary retention. He notes that he has urinary urgency but is unable to urinate however when he is able to, he has hematuria. He explains that he had a stent removed yesterday several hours prior to onset of his symptoms. Pt had a cytoscopy with laser lithotripsy on 12/11/15. No alleviating factors noted. He states that he has not tried any medications PTA. He has an allergy to Penicillins and Amoxicillin. He denies any fever, chills, numbness/tingling in BLE, vomiting, chest pain, shortness of breath, or any other symptoms.   The history is provided by the patient. No language interpreter was used.  Flank Pain  This is a new problem. The current episode started yesterday. The problem has not changed since onset.Pertinent negatives include no chest pain, no abdominal pain, no headaches and no shortness of breath. Nothing aggravates the symptoms. Nothing relieves the symptoms. He has tried nothing for the symptoms.    Past Medical History:  Diagnosis Date  . History of kidney stones     Patient Active Problem List   Diagnosis Date Noted  . Fx boxers 08/23/2012  . Weakness of right arm 08/23/2012  . Lack of coordination 08/23/2012  . LLQ abdominal pain 07/06/2012    Past Surgical History:  Procedure Laterality Date  . CYSTOSCOPY WITH HOLMIUM LASER  LITHOTRIPSY Left 12/11/2015   Procedure: CYSTOSCOPY, ureterscopy, WITH HOLMIUM LASER LITHOTRIPSY and stone removal, left retrograde pyleogram and left stent placement;  Surgeon: Crist FatBenjamin W Herrick, MD;  Location: [facility];  Service: Urology;  Laterality: Left;  . Hand fracture surgery         Home Medications    Prior to Admission medications   Medication Sig Start Date End Date Taking? Authorizing Provider  naproxen (NAPROSYN) 250 MG tablet Take 1 tablet (250 mg total) by mouth 2 (two) times daily with a meal. Patient not taking: Reported on 12/11/2015 12/06/15   Samuel JesterKathleen McManus, DO  phenazopyridine (PYRIDIUM) 200 MG tablet Take 1 tablet (200 mg total) by mouth 3 (three) times daily as needed for pain. 12/11/15   Crist FatBenjamin W Herrick, MD  traMADol (ULTRAM) 50 MG tablet Take 1-2 tablets (50-100 mg total) by mouth every 6 (six) hours as needed for moderate pain. 12/11/15   Crist FatBenjamin W Herrick, MD    Family History Family History  Problem Relation Age of Onset  . Arthritis Mother     Social History Social History  Substance Use Topics  . Smoking status: Never Smoker  . Smokeless tobacco: Never Used  . Alcohol use Yes     Comment: occasional     Allergies   Penicillins and Amoxicillin   Review of Systems Review of Systems  Constitutional: Negative for chills and fever.  Respiratory: Negative for shortness of breath.   Cardiovascular: Negative for chest pain.  Gastrointestinal: Positive for nausea. Negative for abdominal pain and vomiting.  Genitourinary: Positive  for difficulty urinating and flank pain.  Neurological: Negative for numbness and headaches.  All other systems reviewed and are negative.    Physical Exam Updated Vital Signs BP 136/93 (BP Location: Right Arm)   Pulse 117   Temp 97.7 F (36.5 C) (Oral)   Resp 18   Ht 6\' 2"  (1.88 m)   Wt 225 lb (102.1 kg)   SpO2 96%   BMI 28.89 kg/m   Physical Exam  Nursing note and vitals reviewed.  CONSTITUTIONAL:  Well developed/well nourished HEAD: Normocephalic/atraumatic ENMT: Mucous membranes moist NECK: supple no meningeal signs SPINE/BACK:entire spine nontender CV: S1/S2 noted, no murmurs/rubs/gallops noted LUNGS: Lungs are clear to auscultation bilaterally, no apparent distress ABDOMEN: soft, mild suprapubic tenderness noted, no rebound or guarding, bowel sounds noted throughout abdomen GU: mild left cva tenderness; no scrotal tenderness noted, wife at bedside per patient request NEURO: Pt is awake/alert/appropriate, moves all extremitiesx4.  No facial droop.   EXTREMITIES: pulses normal/equal, full ROM SKIN: warm, color normal PSYCH: no abnormalities of mood noted, alert and oriented to situation   ED Treatments / Results  DIAGNOSTIC STUDIES: Oxygen Saturation is 96% on RA, normal by my interpretation.    COORDINATION OF CARE: 2:38 AM- Pt advised of plan for treatment and pt agrees. Pt will receive lab work for further evaluation. He will receive Norco.    Labs (all labs ordered are listed, but only abnormal results are displayed) Labs Reviewed  URINALYSIS, ROUTINE W REFLEX MICROSCOPIC (NOT AT [facility]) - Abnormal; Notable for the following:       Result Value   Color, Urine RED (*)    APPearance CLOUDY (*)    Hgb urine dipstick LARGE (*)    Bilirubin Urine MODERATE (*)    Ketones, ur 15 (*)    Protein, ur 100 (*)    Nitrite POSITIVE (*)    Leukocytes, UA MODERATE (*)    All other components within normal limits  URINE MICROSCOPIC-ADD ON - Abnormal; Notable for the following:    Squamous Epithelial / LPF 0-5 (*)    Bacteria, UA FEW (*)    All other components within normal limits  URINE CULTURE    EKG  EKG Interpretation None       Radiology No results found.  Procedures Procedures (including critical care time)  Medications Ordered in ED Medications  HYDROcodone-acetaminophen (NORCO/VICODIN) 5-325 MG per tablet 2 tablet (2 tablets Oral Given 12/18/15 0300)      Initial Impression / Assessment and Plan / ED Course  Zadie Rhine, MD has reviewed the triage vital signs and the nursing notes.  Pertinent labs results that were available during my care of the patient were reviewed by me and considered in my medical decision making (see chart for details).  Clinical Course     Pt well appearing He is NOT septic appearing After taking PO fluids/meds, his symptoms are resolved He did not have evidence of urinary retention on bladder scan He was able to void urine without difficulty He is afebrile/nontoxic He does have bacteruria, will send urine culture and start cipro (PCN allergy noted) He does not have any residual stents in place He does not appear to have pyelonephritis at this time Advised if symptoms continue over next 24 hours he should call his urologist for close followup  Final Clinical Impressions(s) / ED Diagnoses   Final diagnoses:  Complicated UTI (urinary tract infection)    New Prescriptions New Prescriptions   CIPROFLOXACIN (CIPRO) 500 MG TABLET  Take 1 tablet (500 mg total) by mouth 2 (two) times daily. One po bid x 7 days   I personally performed the services described in this documentation, which was scribed in my presence. The recorded information has been reviewed and is accurate.        Zadie Rhineonald Michaeljames Milnes, MD 12/18/15 [phone]

## 2015-12-18 NOTE — ED Provider Notes (Signed)
[facility]-EMERGENCY DEPT Provider Note   CSN: [id number] Arrival date & time: 12/18/15  1401     History   Chief Complaint Chief Complaint  Patient presents with  . Emesis  . Fever    HPI Tony Turner is a 25 y.o. male.  Patient is 25 yo M with history of kidney stones, presented to ED for left flank pain today at 1:55 AM, now back with worsening pain, nausea/vomiting, and progressive hematuria since d/c. He underwent cystoscopy with lithotripsy and stone removal performed by Dr. Berniece SalinesBenjamin Herrick on 11/2, and had stent removed at [facility] office on 11/8. Since then he's had persistent left flank pain, now at its worst, rated 10/10. He was treated for UTI this morning, states his urine was relatively clear after d/c from ED, and started course of Cipro. However, he started vomiting around noon and noted gross hematuria. Also reports subjective fevers and chills. He denies any abdominal pain. He did not notify Dr. Marlou PorchHerrick about this problem.      Past Medical History:  Diagnosis Date  . History of kidney stones     Patient Active Problem List   Diagnosis Date Noted  . Fx boxers 08/23/2012  . Weakness of right arm 08/23/2012  . Lack of coordination 08/23/2012  . LLQ abdominal pain 07/06/2012    Past Surgical History:  Procedure Laterality Date  . CYSTOSCOPY WITH HOLMIUM LASER LITHOTRIPSY Left 12/11/2015   Procedure: CYSTOSCOPY, ureterscopy, WITH HOLMIUM LASER LITHOTRIPSY and stone removal, left retrograde pyleogram and left stent placement;  Surgeon: Crist FatBenjamin W Herrick, MD;  Location: [facility];  Service: Urology;  Laterality: Left;  . Hand fracture surgery         Home Medications    Prior to Admission medications   Medication Sig Start Date End Date Taking? Authorizing Provider  ciprofloxacin (CIPRO) 500 MG tablet Take 1 tablet (500 mg total) by mouth 2 (two) times daily. One po bid x 7 days 12/18/15   Zadie Rhineonald Wickline, MD  ibuprofen (ADVIL,MOTRIN) 200 MG tablet  Take 400 mg by mouth every 6 (six) hours as needed for moderate pain.    Historical Provider, MD  ondansetron (ZOFRAN) 4 MG tablet Take 4 mg by mouth every 8 (eight) hours as needed for nausea or vomiting.    Historical Provider, MD  phenazopyridine (PYRIDIUM) 200 MG tablet Take 1 tablet (200 mg total) by mouth 3 (three) times daily as needed for pain. Patient not taking: Reported on 12/18/2015 12/11/15   Crist FatBenjamin W Herrick, MD  traMADol (ULTRAM) 50 MG tablet Take 1-2 tablets (50-100 mg total) by mouth every 6 (six) hours as needed for moderate pain. Patient not taking: Reported on 12/18/2015 12/11/15   Crist FatBenjamin W Herrick, MD    Family History Family History  Problem Relation Age of Onset  . Arthritis Mother     Social History Social History  Substance Use Topics  . Smoking status: Never Smoker  . Smokeless tobacco: Never Used  . Alcohol use Yes     Comment: occasional     Allergies   Penicillins and Amoxicillin   Review of Systems Review of Systems  Constitutional: Positive for chills and fever.  HENT: Negative for ear pain and sore throat.   Eyes: Negative for pain and visual disturbance.  Respiratory: Negative for cough and shortness of breath.   Cardiovascular: Negative for chest pain, palpitations and leg swelling.  Gastrointestinal: Positive for nausea and vomiting. Negative for abdominal pain and blood in stool.  Genitourinary:  Positive for dysuria, flank pain and hematuria.  Musculoskeletal: Negative for back pain and neck pain.  Skin: Negative for color change and rash.  Neurological: Negative for dizziness, seizures, syncope, weakness, numbness and headaches.     Physical Exam Updated Vital Signs BP 119/83 (BP Location: Right Arm)   Pulse 89   Temp 98.5 F (36.9 C) (Oral)   Resp 16   SpO2 99%   Physical Exam  Constitutional: He appears well-developed and well-nourished.  Appears uncomfortable lying in bed.  HENT:  Head: Normocephalic and atraumatic.    Mouth/Throat: Oropharynx is clear and moist.  Eyes: Conjunctivae are normal.  Neck: Normal range of motion.  Cardiovascular: Normal rate, regular rhythm, normal heart sounds and intact distal pulses.   Pulmonary/Chest: Effort normal and breath sounds normal. No respiratory distress.  Abdominal: Soft. Bowel sounds are normal. He exhibits no distension. There is tenderness (LUQ and LLQ mildly TTP). There is no guarding.  Positive CVA tenderness on left side only.  Musculoskeletal: Normal range of motion. He exhibits no edema or tenderness.  Neurological: He is alert.  Skin: Skin is warm and dry.  Psychiatric: He has a normal mood and affect.  Nursing note and vitals reviewed.    ED Treatments / Results  Labs (all labs ordered are listed, but only abnormal results are displayed) Labs Reviewed  BASIC METABOLIC PANEL - Abnormal; Notable for the following:       Result Value   Glucose, Bld 108 (*)    Creatinine, Ser 1.28 (*)    All other components within normal limits  CBC WITH DIFFERENTIAL/PLATELET - Abnormal; Notable for the following:    WBC 14.3 (*)    MCHC 36.3 (*)    Neutro Abs 11.7 (*)    Monocytes Absolute 1.2 (*)    All other components within normal limits  URINALYSIS, ROUTINE W REFLEX MICROSCOPIC (NOT AT [facility]) - Abnormal; Notable for the following:    Color, Urine RED (*)    APPearance CLOUDY (*)    Hgb urine dipstick LARGE (*)    Bilirubin Urine MODERATE (*)    Protein, ur 100 (*)    Nitrite POSITIVE (*)    Leukocytes, UA MODERATE (*)    All other components within normal limits  URINE MICROSCOPIC-ADD ON - Abnormal; Notable for the following:    Squamous Epithelial / LPF 0-5 (*)    Bacteria, UA MANY (*)    All other components within normal limits  URINE CULTURE    EKG  EKG Interpretation None       Radiology No results found.  Procedures Procedures (including critical care time)  Medications Ordered in ED Medications  ondansetron (ZOFRAN)  injection 4 mg (4 mg Intravenous Given 12/18/15 1709)  ketorolac (TORADOL) 30 MG/ML injection 30 mg (30 mg Intravenous Given 12/18/15 1709)  morphine 4 MG/ML injection 4 mg (4 mg Intravenous Given 12/18/15 1835)  levofloxacin (LEVAQUIN) IVPB 500 mg (0 mg Intravenous Stopped 12/18/15 1847)  tamsulosin (FLOMAX) capsule 0.4 mg (0.4 mg Oral Given 12/18/15 1835)  diphenhydrAMINE (BENADRYL) injection 50 mg (50 mg Intravenous Given 12/18/15 1859)  levofloxacin (LEVAQUIN) IVPB 500 mg (0 mg Intravenous Stopped 12/18/15 2104)     Initial Impression / Assessment and Plan / ED Course  I have reviewed the triage vital signs and the nursing notes.  Pertinent labs & imaging results that were available during my care of the patient were reviewed by me and considered in my medical decision making (see chart for details).  Clinical Course as of Dec 19 1123  Thu Dec 18, 2015  2111 Pt seen and examined.   Appears non toxic.  UA suggest UTI.  Arm also examined after the IV abx.  No systemic reaction noted.  Will continue abx and monitor.  [JK]    Clinical Course User Index [JK] Linwood DibblesJon Knapp, MD   Patient is 25 yo M presenting to ED after evaluation this morning with worsening pain, nausea/vomiting, and progressive hematuria since d/c. VSS and non-toxic appearance. Given IV Toradol and Zofran for pain and nausea. BMP unremarkable. CBC shows mild leukocytosis of 14.3. Urinalysis shows evidence of infection with positive nitrite, but no significant change from specimen obtained earlier today. Urine sent for culture. Consult place to [facility] to discuss findings, and Dr. Vernie Ammonsttelin returned call, recommending dose of IV Levaquin, Flomax to relieve ureteral spasms, continued course of PO Cipro, and f/u tomorrow in office with patient's urologist, Dr. Marlou PorchHerrick. Patient agreeable to plan, but remained in significant pain. IV morphine given as well as Flomax. IV Levaquin started, and patient developed mild allergic reaction with  erythema and itching at IV site. Known allergy to penicillins, but not fluoroquinolones. Given IV benadryl and itching resolved. Seen and evaluated by attending Dr. Lynelle DoctorKnapp who determined patient was stable for d/c home with plan discussed. Given prescription Zofran for nausea, and Flomax for ureteral spasms. Advised to call and schedule f/u appointment with Dr. Marlou PorchHerrick tomorrow, and to continue previously prescribed course of Cipro. Return precautions discussed for fevers, abdominal pain, flank pain, or vomiting.  Final Clinical Impressions(s) / ED Diagnoses   Final diagnoses:  Complicated UTI (urinary tract infection)    New Prescriptions Discharge Medication List as of 12/18/2015  9:22 PM    START taking these medications   Details  ondansetron (ZOFRAN ODT) 4 MG disintegrating tablet Take 1 tablet (4 mg total) by mouth every 8 (eight) hours as needed for nausea or vomiting., Starting Thu 12/18/2015, Print    tamsulosin (FLOMAX) 0.4 MG CAPS capsule Take 1 capsule (0.4 mg total) by mouth daily., Starting Thu 12/18/2015, Print         Alenah Sarria F de Gu OidakVillier II, [place] 12/19/15 1148    Linwood DibblesJon Knapp, MD 12/22/15 1312

## 2015-12-18 NOTE — ED Notes (Signed)
Bladder scan only showed 36ml of urine

## 2015-12-19 LAB — URINE CULTURE: CULTURE: NO GROWTH

## 2015-12-20 LAB — URINE CULTURE: Culture: NO GROWTH

## 2017-06-07 ENCOUNTER — Other Ambulatory Visit: Payer: Self-pay

## 2017-06-07 ENCOUNTER — Encounter (HOSPITAL_COMMUNITY): Payer: Self-pay | Admitting: *Deleted

## 2017-06-07 ENCOUNTER — Emergency Department (HOSPITAL_COMMUNITY)
Admission: EM | Admit: 2017-06-07 | Discharge: 2017-06-07 | Disposition: A | Discharge status: Home / Self Care | Payer: Medicaid Other | Attending: Emergency Medicine | Admitting: Emergency Medicine

## 2017-06-07 DIAGNOSIS — M79642 Pain in left hand: Secondary | ICD-10-CM | POA: Diagnosis not present

## 2017-06-07 DIAGNOSIS — L255 Unspecified contact dermatitis due to plants, except food: Secondary | ICD-10-CM | POA: Diagnosis not present

## 2017-06-07 DIAGNOSIS — M79641 Pain in right hand: Secondary | ICD-10-CM | POA: Diagnosis present

## 2017-06-07 DIAGNOSIS — Z79899 Other long term (current) drug therapy: Secondary | ICD-10-CM | POA: Diagnosis not present

## 2017-06-07 MED ORDER — OXYCODONE-ACETAMINOPHEN 5-325 MG PO TABS: 1.0000 | ORAL_TABLET | ORAL | 0 refills | Status: DC | PRN

## 2017-06-07 MED ORDER — DEXAMETHASONE 4 MG PO TABS
10.0000 mg | ORAL_TABLET | Freq: Once | ORAL | Status: AC
  Administered 2017-06-07: 10 mg via ORAL
  Filled 2017-06-07: qty 3

## 2017-06-07 NOTE — Discharge Instructions (Addendum)
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

## 2017-06-07 NOTE — ED Provider Notes (Signed)
[facility] EMERGENCY DEPARTMENT Provider Note   CSN: [id number] Arrival date & time: 06/07/17  0044     History   Chief Complaint Chief Complaint  Patient presents with  . Hand Pain    HPI Tony Turner is a 26 y.o. male.  The history is provided by the patient.  He has history of kidney stones and comes in with burning pain in his hands after cutting jalapeno peppers.  Pain is severe and he rates it at 10/10.  He has been unable to improve the pain in spite of washing his hands multiple times.  He has taken ibuprofen for pain with no relief.  The only time his hands feel at all tolerable as if he keeps them in cold water.  Past Medical History:  Diagnosis Date  . History of kidney stones     Patient Active Problem List   Diagnosis Date Noted  . Fx boxers 08/23/2012  . Weakness of right arm 08/23/2012  . Lack of coordination 08/23/2012  . LLQ abdominal pain 07/06/2012    Past Surgical History:  Procedure Laterality Date  . CYSTOSCOPY WITH HOLMIUM LASER LITHOTRIPSY Left 12/11/2015   Procedure: CYSTOSCOPY, ureterscopy, WITH HOLMIUM LASER LITHOTRIPSY and stone removal, left retrograde pyleogram and left stent placement;  Surgeon: Crist Fat, MD;  Location: [facility];  Service: Urology;  Laterality: Left;  . Hand fracture surgery          Home Medications    Prior to Admission medications   Medication Sig Start Date End Date Taking? Authorizing Provider  ciprofloxacin (CIPRO) 500 MG tablet Take 1 tablet (500 mg total) by mouth 2 (two) times daily. One po bid x 7 days 12/18/15   Zadie Rhine, MD  ibuprofen (ADVIL,MOTRIN) 200 MG tablet Take 400 mg by mouth every 6 (six) hours as needed for moderate pain.    [provider]  ondansetron (ZOFRAN ODT) 4 MG disintegrating tablet Take 1 tablet (4 mg total) by mouth every 8 (eight) hours as needed for nausea or vomiting. 12/18/15   de Villier, Daryl F II, PA  ondansetron (ZOFRAN) 4 MG tablet Take 4 mg by  mouth every 8 (eight) hours as needed for nausea or vomiting.    [provider]  phenazopyridine (PYRIDIUM) 200 MG tablet Take 1 tablet (200 mg total) by mouth 3 (three) times daily as needed for pain. Patient not taking: Reported on 12/18/2015 12/11/15   Crist Fat, MD  tamsulosin (FLOMAX) 0.4 MG CAPS capsule Take 1 capsule (0.4 mg total) by mouth daily. 12/18/15   de Villier, Daryl F II, PA  traMADol (ULTRAM) 50 MG tablet Take 1-2 tablets (50-100 mg total) by mouth every 6 (six) hours as needed for moderate pain. 12/11/15   Crist Fat, MD    Family History Family History  Problem Relation Age of Onset  . Arthritis Mother     Social History Social History   Tobacco Use  . Smoking status: Never Smoker  . Smokeless tobacco: Never Used  Substance Use Topics  . Alcohol use: Yes    Comment: occasional  . Drug use: No     Allergies   Penicillins and Amoxicillin   Review of Systems Review of Systems  All other systems reviewed and are negative.    Physical Exam Updated Vital Signs BP 131/81 (BP Location: Right Arm)   Pulse 87   Temp 98.4 F (36.9 C) (Oral)   Resp 18   Ht  (1.88  m)   Wt 102.1 kg (225 lb)   SpO2 98%   BMI 28.89 kg/m   Physical Exam  Nursing note and vitals reviewed.  27 year old male, resting comfortably and in no acute distress. Vital signs are normal. Oxygen saturation is 98%, which is normal. Head is normocephalic and atraumatic. PERRLA, EOMI. Oropharynx is clear. Neck is nontender and supple without adenopathy or JVD. Back is nontender and there is no CVA tenderness. Lungs are clear without rales, wheezes, or rhonchi. Chest is nontender. Heart has regular rate and rhythm without murmur. Abdomen is soft, flat, nontender without masses or hepatosplenomegaly and peristalsis is normoactive. Extremities have no cyanosis or edema, full range of motion is present.  No obvious rash on his hands. Skin is warm and  dry. Neurologic: Mental status is normal, cranial nerves are intact, there are no motor or sensory deficits.  ED Treatments / Results   Procedures Procedures   Medications Ordered in ED Medications - No data to display   Initial Impression / Assessment and Plan / ED Course  I have reviewed the triage vital signs and the nursing notes.  Skin irritation secondary to capsaicin.  His hands were washed alternately in alcohol and soap and he is discharged with a take-home pack of oxycodone-acetaminophen.  Advised to wear protective gloves when working with hot peppers.  Final Clinical Impressions(s) / ED Diagnoses   Final diagnoses:  Dermatitis due to plant    ED Discharge Orders        Ordered    oxyCODONE-acetaminophen (PERCOCET) 5-325 MG tablet  Every 4 hours PRN     06/07/17 0336       Dione Booze, MD 06/07/17 [phone]

## 2017-06-07 NOTE — ED Triage Notes (Signed)
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

## 2017-11-13 IMAGING — CT CT RENAL STONE PROTOCOL
2 of 3 series · 16 of 46 positions shown, 18 images · non-contrast
Comparison: 05/14/2011 CT abdomen/pelvis.

CLINICAL DATA: Left flank pain for 2 weeks with recent worsening.
Hematuria.

EXAM:
CT ABDOMEN AND PELVIS WITHOUT CONTRAST
TECHNIQUE: Multidetector CT imaging of the abdomen and pelvis was performed
following the standard protocol without IV contrast.

[Series 2: axial st · axial · 0.84mm/px · z∈[-392,+72]mm · 13 of 107 slices shown, 15 images]
[im 7/107  soft-tissue]
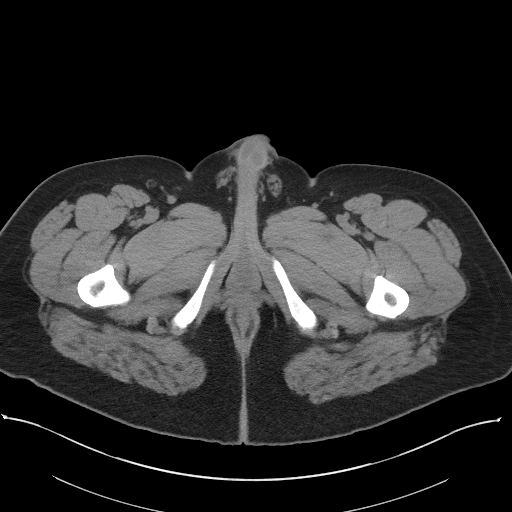
[im 7/107  bone]
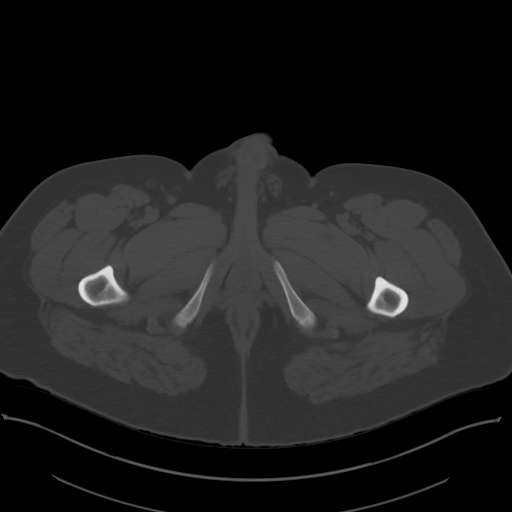
[im 14/107  soft-tissue]
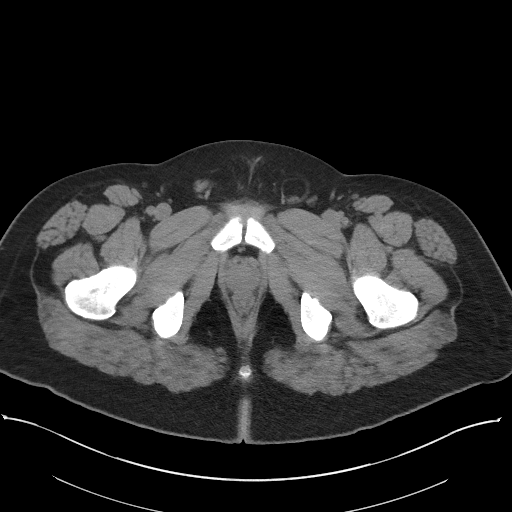
[im 21/107  soft-tissue]
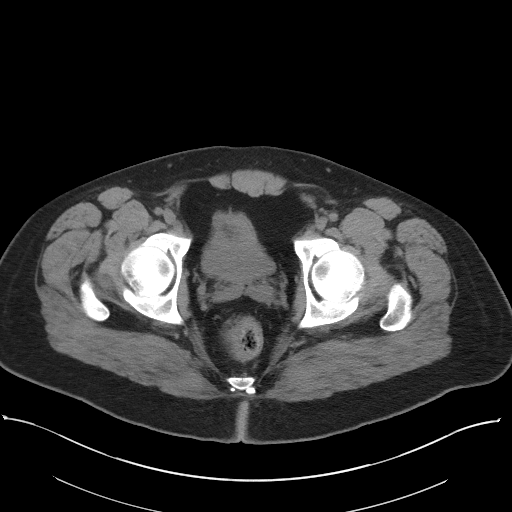
[im 31/107  soft-tissue]
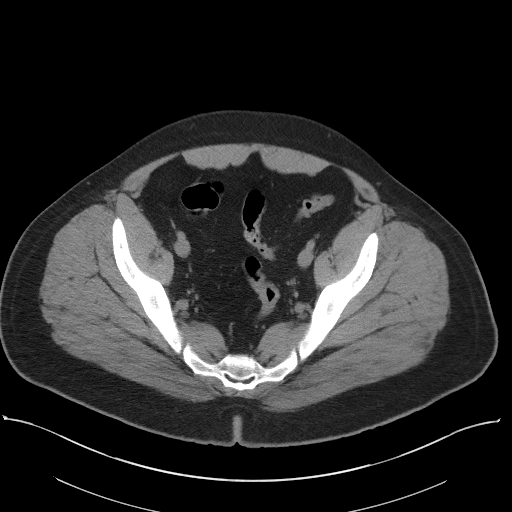
[im 38/107  soft-tissue]
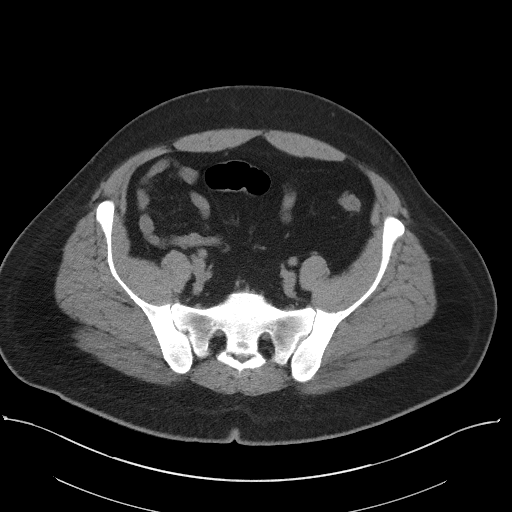
[im 45/107  soft-tissue]
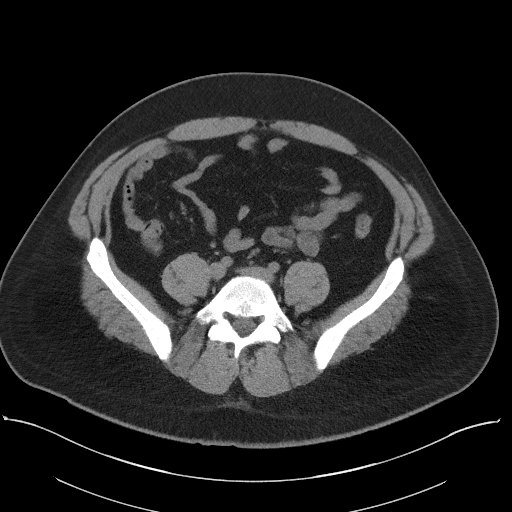
[im 55/107  soft-tissue]
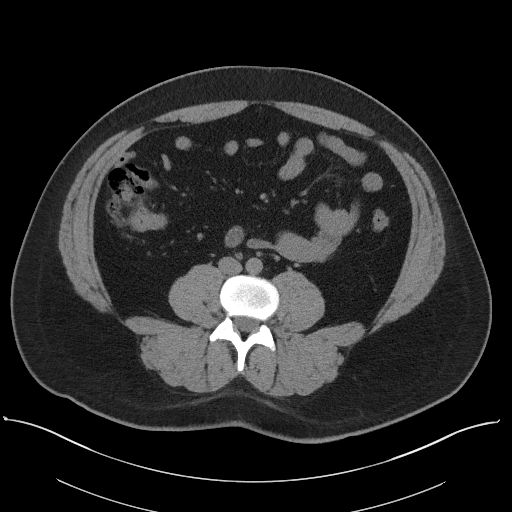
[im 62/107  soft-tissue]
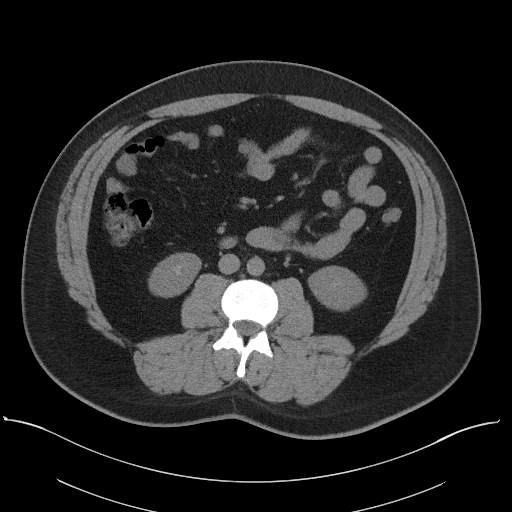
[im 69/107  soft-tissue]
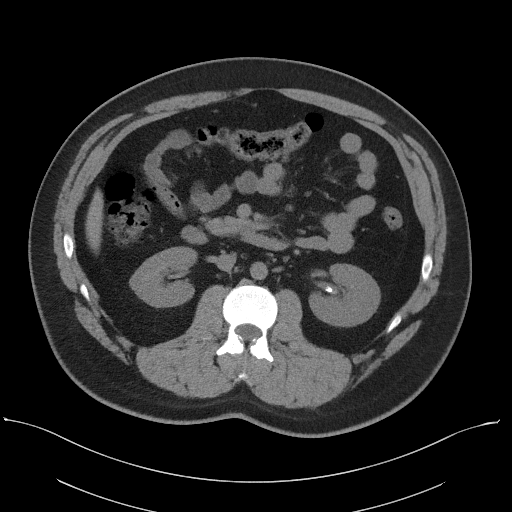
[im 69/107  bone]
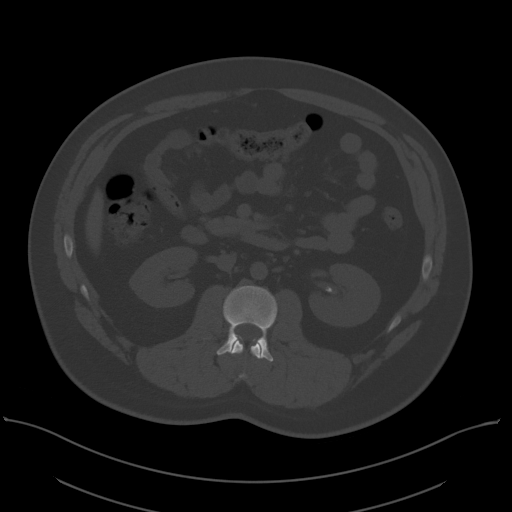
[im 76/107  soft-tissue]
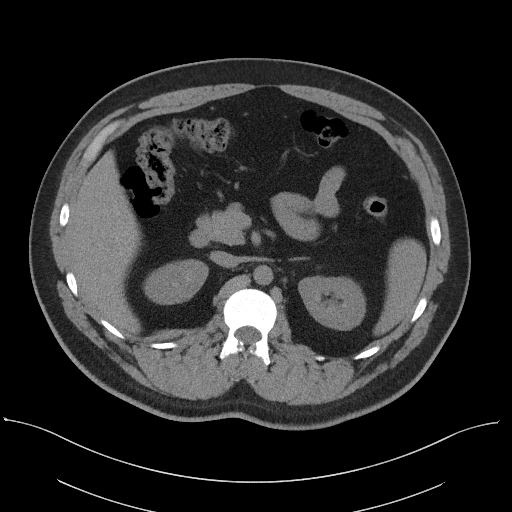
[im 86/107  soft-tissue]
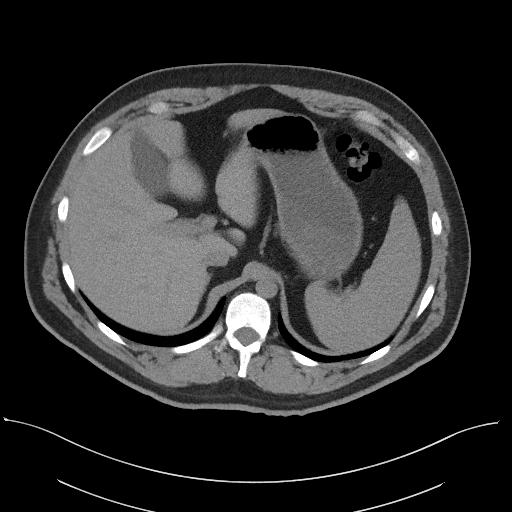
[im 93/107  soft-tissue]
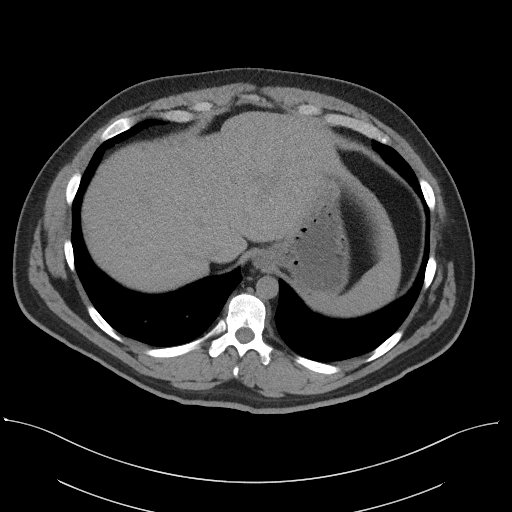
[im 100/107  soft-tissue]
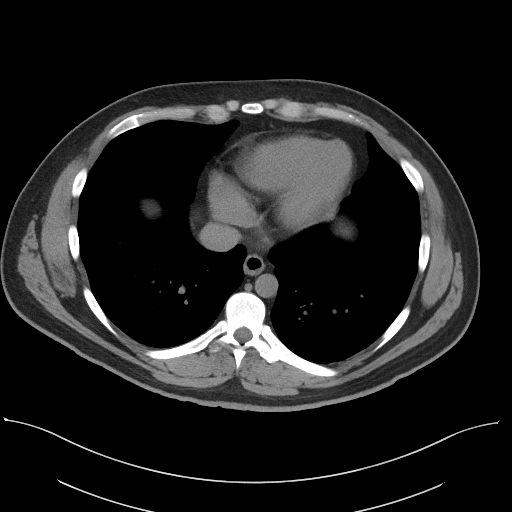

[Series 3: coronal st · coronal · 0.79mm/px · 3 of 101 slices shown]
[im 34/101  soft-tissue]
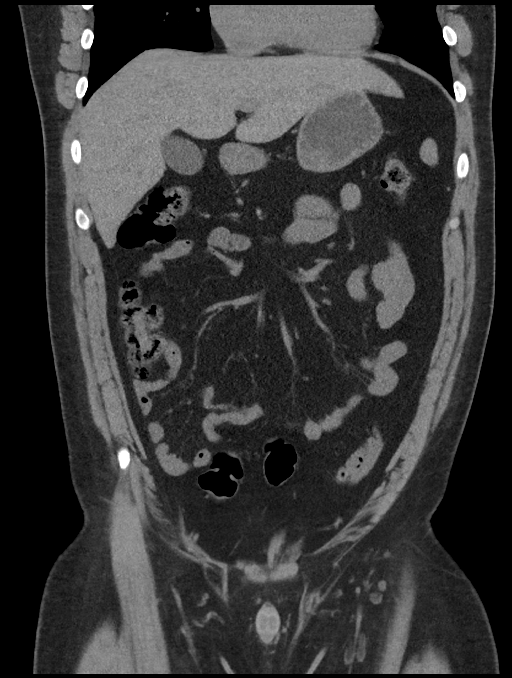
[im 45/101  soft-tissue]
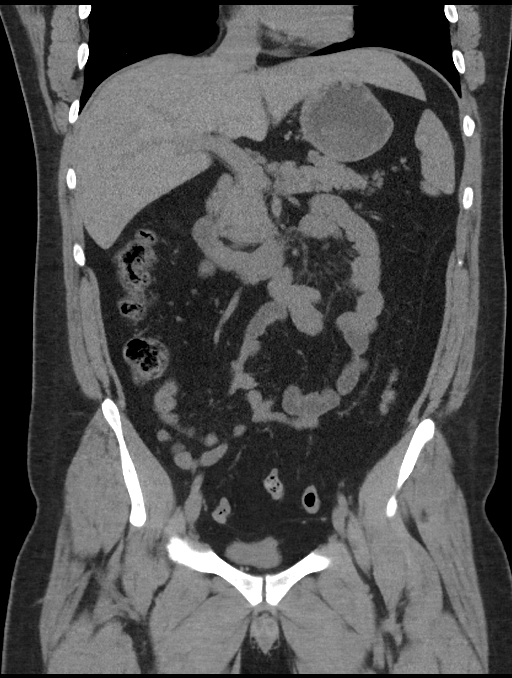
[im 56/101  soft-tissue]
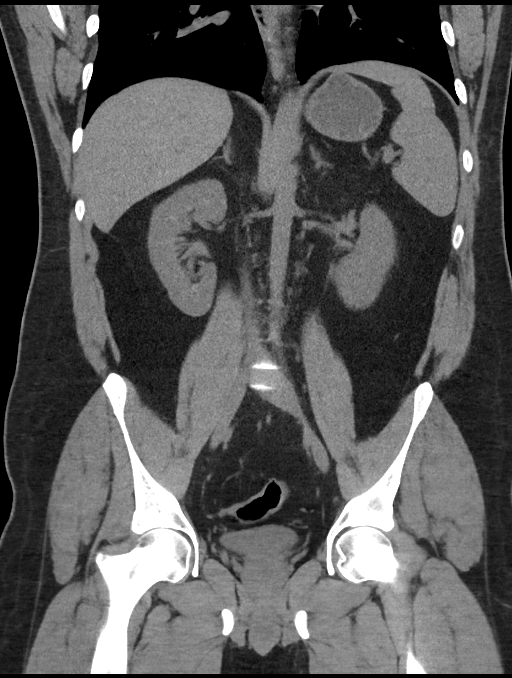

[16 of 46 positions shown; findings below may reference images not displayed]

FINDINGS: Lower chest: No significant pulmonary nodules or acute consolidative
airspace disease.

Hepatobiliary: Normal liver with no liver mass. Normal gallbladder
with no radiopaque cholelithiasis. No biliary ductal dilatation.

Pancreas: Normal, with no mass or duct dilation.

Spleen: Normal size. No mass.

Adrenals/Urinary Tract: Normal adrenals. Left renal pelvis 10 x 5 mm
stone. Mild fullness of the left renal collecting system without
overt hydronephrosis. No additional renal stones. No right
hydronephrosis. No perinephric fat stranding. No contour deforming
renal mass. Normal caliber ureters, with no ureteral stones.
Collapsed and grossly normal bladder with no bladder stones.

Stomach/Bowel: Grossly normal stomach. Normal caliber small bowel
with no small bowel wall thickening. Normal appendix. Normal large
bowel with no diverticulosis, large bowel wall thickening or
pericolonic fat stranding.

Vascular/Lymphatic: Normal caliber abdominal aorta. No
pathologically enlarged lymph nodes in the abdomen or pelvis.

Reproductive: Normal size prostate.

Other: No pneumoperitoneum, ascites or focal fluid collection.
Asymmetric fat in the left inguinal canal may indicate a tiny fat
containing left inguinal hernia.

Musculoskeletal: No aggressive appearing focal osseous lesions.
IMPRESSION: 1. Left renal pelvis 10 x 5 mm stone. Mild fullness of the left
renal collecting system without overt left hydronephrosis. No
perinephric fat stranding. No additional urolithiasis.
2. Possible tiny fat containing left inguinal hernia.

## 2019-07-12 ENCOUNTER — Emergency Department (HOSPITAL_COMMUNITY)
Admission: EM | Admit: 2019-07-12 | Discharge: 2019-07-12 | Discharge status: Against Medical Advice | Payer: Medicaid Other

## 2019-07-12 ENCOUNTER — Other Ambulatory Visit: Payer: Self-pay

## 2020-08-30 ENCOUNTER — Emergency Department (HOSPITAL_COMMUNITY): Payer: Medicaid Other

## 2020-08-30 ENCOUNTER — Other Ambulatory Visit: Payer: Self-pay

## 2020-08-30 ENCOUNTER — Emergency Department (HOSPITAL_COMMUNITY)
Admission: EM | Admit: 2020-08-30 | Discharge: 2020-08-30 | Disposition: A | Discharge status: Home / Self Care | Payer: Medicaid Other | Attending: Emergency Medicine | Admitting: Emergency Medicine

## 2020-08-30 ENCOUNTER — Encounter (HOSPITAL_COMMUNITY): Payer: Self-pay | Admitting: Emergency Medicine

## 2020-08-30 DIAGNOSIS — R109 Unspecified abdominal pain: Secondary | ICD-10-CM | POA: Diagnosis present

## 2020-08-30 DIAGNOSIS — N201 Calculus of ureter: Secondary | ICD-10-CM | POA: Diagnosis not present

## 2020-08-30 LAB — CBC WITH DIFFERENTIAL/PLATELET
Abs Immature Granulocytes: 0.02 10*3/uL (ref 0.00–0.07)
Basophils Absolute: 0 10*3/uL (ref 0.0–0.1)
Basophils Relative: 1 %
Eosinophils Absolute: 0.1 10*3/uL (ref 0.0–0.5)
Eosinophils Relative: 2 %
HCT: 47.8 % (ref 39.0–52.0)
Hemoglobin: 16.6 g/dL (ref 13.0–17.0)
Immature Granulocytes: 0 %
Lymphocytes Relative: 40 %
Lymphs Abs: 3.3 10*3/uL (ref 0.7–4.0)
MCH: 31.4 pg (ref 26.0–34.0)
MCHC: 34.7 g/dL (ref 30.0–36.0)
MCV: 90.5 fL (ref 80.0–100.0)
Monocytes Absolute: 0.9 10*3/uL (ref 0.1–1.0)
Monocytes Relative: 11 %
Neutro Abs: 3.9 10*3/uL (ref 1.7–7.7)
Neutrophils Relative %: 46 %
Platelets: 247 10*3/uL (ref 150–400)
RBC: 5.28 MIL/uL (ref 4.22–5.81)
RDW: 12.7 % (ref 11.5–15.5)
WBC: 8.2 10*3/uL (ref 4.0–10.5)
nRBC: 0 % (ref 0.0–0.2)

## 2020-08-30 LAB — BASIC METABOLIC PANEL
Anion gap: 7 (ref 5–15)
BUN: 14 mg/dL (ref 6–20)
CO2: 27 mmol/L (ref 22–32)
Calcium: 9.3 mg/dL (ref 8.9–10.3)
Chloride: 106 mmol/L (ref 98–111)
Creatinine, Ser: 1.15 mg/dL (ref 0.61–1.24)
GFR, Estimated: >60 mL/min (ref >60)
Glucose, Bld: 132 mg/dL — ABNORMAL HIGH (ref 70–99)
Potassium: 3.2 mmol/L — ABNORMAL LOW (ref 3.5–5.1)
Sodium: 140 mmol/L (ref 135–145)

## 2020-08-30 LAB — URINALYSIS, ROUTINE W REFLEX MICROSCOPIC
Bilirubin Urine: NEGATIVE
Glucose, UA: NEGATIVE mg/dL
Ketones, ur: 20 mg/dL — AB
Leukocytes,Ua: NEGATIVE
Nitrite: NEGATIVE
Protein, ur: 100 mg/dL — AB
RBC / HPF: >50 RBC/hpf — ABNORMAL HIGH (ref 0–5)
Specific Gravity, Urine: 1.025 (ref 1.005–1.030)
pH: 5 (ref 5.0–8.0)

## 2020-08-30 MED ORDER — KETOROLAC TROMETHAMINE 30 MG/ML IJ SOLN
30.0000 mg | Freq: Once | INTRAMUSCULAR | Status: AC
  Administered 2020-08-30: 30 mg via INTRAVENOUS
  Filled 2020-08-30: qty 1

## 2020-08-30 MED ORDER — SODIUM CHLORIDE 0.9 % IV BOLUS
1000.0000 mL | Freq: Once | INTRAVENOUS | Status: AC
  Administered 2020-08-30: 1000 mL via INTRAVENOUS

## 2020-08-30 MED ORDER — IBUPROFEN 800 MG PO TABS: 800.0000 mg | ORAL_TABLET | Freq: Three times a day (TID) | ORAL | 0 refills | Status: DC | PRN

## 2020-08-30 MED ORDER — SODIUM CHLORIDE 0.9 % IV SOLN
12.5000 mg | Freq: Four times a day (QID) | INTRAVENOUS | Status: DC | PRN
  Administered 2020-08-30: 12.5 mg via INTRAVENOUS
  Filled 2020-08-30: qty 0.5

## 2020-08-30 MED ORDER — KETAMINE HCL 10 MG/ML IJ SOLN
0.3000 mg/kg | Freq: Once | INTRAMUSCULAR | Status: AC
  Administered 2020-08-30: 31 mg via INTRAVENOUS
  Filled 2020-08-30: qty 1

## 2020-08-30 MED ORDER — HYDROMORPHONE HCL 1 MG/ML IJ SOLN
1.0000 mg | Freq: Once | INTRAMUSCULAR | Status: AC
  Administered 2020-08-30: 1 mg via INTRAVENOUS
  Filled 2020-08-30: qty 1

## 2020-08-30 MED ORDER — SODIUM CHLORIDE 0.9 % IV BOLUS
1000.0000 mL | Freq: Once | INTRAVENOUS | Status: AC
Start: 2020-08-30 — End: 2020-08-30
  Administered 2020-08-30: 1000 mL via INTRAVENOUS

## 2020-08-30 MED ORDER — OXYCODONE-ACETAMINOPHEN 5-325 MG PO TABS
1.0000 | ORAL_TABLET | Freq: Once | ORAL | Status: AC
  Administered 2020-08-30: 1 via ORAL
  Filled 2020-08-30: qty 1

## 2020-08-30 MED ORDER — ONDANSETRON HCL 4 MG/2ML IJ SOLN
4.0000 mg | Freq: Once | INTRAMUSCULAR | Status: AC
  Administered 2020-08-30: 4 mg via INTRAVENOUS
  Filled 2020-08-30: qty 2

## 2020-08-30 MED ORDER — ONDANSETRON 4 MG PO TBDP: 4.0000 mg | ORAL_TABLET | Freq: Three times a day (TID) | ORAL | 0 refills | Status: DC | PRN

## 2020-08-30 MED ORDER — PROMETHAZINE HCL 25 MG/ML IJ SOLN
INTRAMUSCULAR | Status: AC
  Filled 2020-08-30: qty 1

## 2020-08-30 MED ORDER — OXYCODONE-ACETAMINOPHEN 5-325 MG PO TABS: 1.0000 | ORAL_TABLET | Freq: Four times a day (QID) | ORAL | 0 refills | Status: DC | PRN

## 2020-08-30 NOTE — ED Notes (Signed)
Family updated as to patient's status.

## 2020-08-30 NOTE — ED Provider Notes (Signed)
[facility] EMERGENCY DEPARTMENT Provider Note   CSN: [id number] Arrival date & time: 08/30/20  7564     History Chief Complaint  Patient presents with   Flank Pain    History of kidney stones just over one year ago. Had surgery for kidney stone. Had to be transferred to a urology center. Pain in right lower side. Took half a pain pill at home.    Tony Turner is a 30 y.o. male.  Patient yelling with flank pain that woke her from sleep about 1 hour ago.  Brought in by EMS with severe right flank pain.  Reports history of kidney stones on the opposite side.  Nausea but no vomiting.  EMS did not give any medications.  No pain with urination or blood in the urine.  No testicular pain.  No fevers, chills.  Nausea but no vomiting.  No chest pain or shortness of breath.  Pain feels similar to previous kidney stones  The history is provided by the patient. The history is limited by the condition of the patient.  Flank Pain Pertinent negatives include no chest pain, no abdominal pain, no headaches and no shortness of breath.      Past Medical History:  Diagnosis Date   History of kidney stones     Patient Active Problem List   Diagnosis Date Noted   Fx boxers 08/23/2012   Weakness of right arm 08/23/2012   Lack of coordination 08/23/2012   LLQ abdominal pain 07/06/2012    Past Surgical History:  Procedure Laterality Date   CYSTOSCOPY WITH HOLMIUM LASER LITHOTRIPSY Left 12/11/2015   Procedure: CYSTOSCOPY, ureterscopy, WITH HOLMIUM LASER LITHOTRIPSY and stone removal, left retrograde pyleogram and left stent placement;  Surgeon: Crist Fat, MD;  Location: [facility];  Service: Urology;  Laterality: Left;   Hand fracture surgery         Family History  Problem Relation Age of Onset   Arthritis Mother     Social History   Tobacco Use   Smoking status: Never   Smokeless tobacco: Never  Substance Use Topics   Alcohol use: Yes    Comment: occasional   Drug use: No     Home Medications Prior to Admission medications   Medication Sig Start Date End Date Taking? Authorizing Provider  ibuprofen (ADVIL,MOTRIN) 200 MG tablet Take 400 mg by mouth every 6 (six) hours as needed for moderate pain.    [provider]  oxyCODONE-acetaminophen (PERCOCET) 5-325 MG tablet Take 1 tablet by mouth every 4 (four) hours as needed for moderate pain. 06/07/17   Dione Booze, MD    Allergies    Penicillins and Amoxicillin  Review of Systems   Review of Systems  Constitutional:  Negative for activity change, appetite change and fever.  HENT:  Negative for congestion.   Respiratory:  Negative for cough, chest tightness and shortness of breath.   Cardiovascular:  Negative for chest pain.  Gastrointestinal:  Positive for nausea. Negative for abdominal pain and vomiting.  Genitourinary:  Positive for dysuria and flank pain. Negative for decreased urine volume, hematuria, scrotal swelling and testicular pain.  Musculoskeletal:  Positive for back pain.  Skin:  Negative for wound.  Neurological:  Negative for dizziness, weakness and headaches.   all other systems are negative except as noted in the HPI and PMH.   Physical Exam Updated Vital Signs BP (!) 125/98   Pulse 98   Temp 97.6 F (36.4 C) (Oral)   Resp 20  Ht 6\' 2"  (1.88 m)   Wt 102.1 kg   SpO2 100%   BMI 28.89 kg/m   Physical Exam Vitals and nursing note reviewed.  Constitutional:      General: He is in acute distress.     Appearance: He is well-developed. He is diaphoretic.     Comments: Diaphoretic, yelling in pain  HENT:     Head: Normocephalic and atraumatic.     Mouth/Throat:     Pharynx: No oropharyngeal exudate.  Eyes:     Conjunctiva/sclera: Conjunctivae normal.     Pupils: Pupils are equal, round, and reactive to light.  Neck:     Comments: No meningismus. Cardiovascular:     Rate and Rhythm: Normal rate and regular rhythm.     Heart sounds: Normal heart sounds. No murmur  heard. Pulmonary:     Effort: Pulmonary effort is normal. No respiratory distress.     Breath sounds: Normal breath sounds.  Chest:     Chest wall: No tenderness.  Abdominal:     Palpations: Abdomen is soft.     Tenderness: There is no abdominal tenderness. There is no guarding or rebound.  Genitourinary:    Comments: No testicular pain Musculoskeletal:        General: Swelling present. No tenderness. Normal range of motion.     Cervical back: Normal range of motion and neck supple.     Comments: R CVAT  Skin:    General: Skin is warm.  Neurological:     Mental Status: He is alert and oriented to person, place, and time.     Cranial Nerves: No cranial nerve deficit.     Motor: No abnormal muscle tone.     Coordination: Coordination normal.     Comments:  5/5 strength throughout. CN 2-12 intact.Equal grip strength.   Psychiatric:        Behavior: Behavior normal.    ED Results / Procedures / Treatments   Labs (all labs ordered are listed, but only abnormal results are displayed) Labs Reviewed  BASIC METABOLIC PANEL - Abnormal; Notable for the following components:      Result Value   Potassium 3.2 (*)    Glucose, Bld 132 (*)    All other components within normal limits  URINALYSIS, ROUTINE W REFLEX MICROSCOPIC - Abnormal; Notable for the following components:   APPearance HAZY (*)    Hgb urine dipstick LARGE (*)    Ketones, ur 20 (*)    Protein, ur 100 (*)    RBC / HPF >50 (*)    Bacteria, UA FEW (*)    All other components within normal limits  CBC WITH DIFFERENTIAL/PLATELET    EKG None  Radiology CT Renal Stone Study  Result Date: 08/30/2020 CLINICAL DATA:  Right side flank pain started this morning. Radiates to the groin. EXAM: CT ABDOMEN AND PELVIS WITHOUT CONTRAST TECHNIQUE: Multidetector CT imaging of the abdomen and pelvis was performed following the standard protocol without IV contrast. COMPARISON:  12/06/2015 FINDINGS: Lower chest: Unremarkable.  Hepatobiliary: 7 mm low-density lesion anterior segment III was present previously, too small to characterize but consistent with benign etiology. There is no evidence for gallstones, gallbladder wall thickening, or pericholecystic fluid. No intrahepatic or extrahepatic biliary dilation. Pancreas: No focal mass lesion. No dilatation of the main duct. No intraparenchymal cyst. No peripancreatic edema. Spleen: No splenomegaly. No focal mass lesion. Adrenals/Urinary Tract: No adrenal nodule or mass. 1 mm nonobstructing stone identified lower interpolar right kidney. There is mild  fullness of the right intrarenal collecting system and ureter with mild right periureteric edema. 1 mm stone identified at the right UVJ (axial 85/2 and sagittal 70/6). No stones in the left kidney or ureter. No secondary changes in the left kidney or ureter. Stomach/Bowel: Stomach is unremarkable. No gastric wall thickening. No evidence of outlet obstruction. Duodenum is normally positioned as is the ligament of Treitz. No small bowel wall thickening. No small bowel dilatation. The terminal ileum is normal. The appendix is normal. No gross colonic mass. No colonic wall thickening. Vascular/Lymphatic: No abdominal aortic aneurysm. No abdominal aortic atherosclerotic calcification. There is no gastrohepatic or hepatoduodenal ligament lymphadenopathy. No retroperitoneal or mesenteric lymphadenopathy. No pelvic sidewall lymphadenopathy. Reproductive: The prostate gland and seminal vesicles are unremarkable. Other: No intraperitoneal free fluid. Musculoskeletal: Small left groin hernia contains only fat. No worrisome lytic or sclerotic osseous abnormality. IMPRESSION: 1. 1 mm right UVJ stone with mild secondary changes in the right kidney and ureter. 2. 1 mm nonobstructing stone lower interpolar right kidney. 3. Small left groin hernia contains only fat. Electronically Signed   By: Kennith Center M.D.   On: 08/30/2020 06:27     Procedures Procedures   Medications Ordered in ED Medications  sodium chloride 0.9 % bolus 1,000 mL (has no administration in time range)  ondansetron (ZOFRAN) injection 4 mg (has no administration in time range)  HYDROmorphone (DILAUDID) injection 1 mg (has no administration in time range)  ketorolac (TORADOL) 30 MG/ML injection 30 mg (has no administration in time range)    ED Course  I have reviewed the triage vital signs and the nursing notes.  Pertinent labs & imaging results that were available during my care of the patient were reviewed by me and considered in my medical decision making (see chart for details).    MDM Rules/Calculators/A&P                           Flank pain with nausea and vomiting similar to previous kidney stones  Uncomfortable and vomiting UA shows RBCs without significant infection.  Does have few bacteria.  Will send culture.  Negative nitrite negative leukocyte esterase. Creatinine is normal.  CT scan shows 1 minimally right UVJ stone.  Patient continues to have pain.  Will attempt additional medications.  Advised follow-up with urology after pain and nausea are controlled he can likely be discharged to pass a stone on his own.  Return to the ED with worsening pain, fever, vomiting, unable to urinate, or any other concerns Final Clinical Impression(s) / ED Diagnoses Final diagnoses:  Ureteral stone    Rx / DC Orders ED Discharge Orders     None        Tate Jerkins, Jeannett Senior, MD 08/30/20 0730

## 2020-08-30 NOTE — Discharge Instructions (Signed)
Take the pain and nausea medications as prescribed.  Follow-up with your doctor and urologist.  Return to the ED with worsening pain, fever, vomiting, unable to urinate or any other concerns.

## 2020-08-30 NOTE — ED Notes (Signed)
Patient encouraged to produce a urine specimen for testing. Patient standing at bedside is unable to produce urine. Fluid bolus complete.

## 2020-08-30 NOTE — ED Notes (Signed)
Standing at bedside attempting to urinate in urinal for specimen collection.

## 2020-08-30 NOTE — ED Notes (Signed)
PO challenge with a cup of water.

## 2020-08-31 LAB — URINE CULTURE: Culture: NO GROWTH

## 2021-08-27 ENCOUNTER — Encounter: Payer: Self-pay | Admitting: Internal Medicine

## 2021-09-22 ENCOUNTER — Ambulatory Visit: Payer: Medicaid Other | Admitting: Gastroenterology

## 2021-09-29 ENCOUNTER — Telehealth: Payer: Self-pay | Admitting: Gastroenterology

## 2021-09-29 ENCOUNTER — Encounter: Payer: Self-pay | Admitting: *Deleted

## 2021-09-29 ENCOUNTER — Ambulatory Visit: Payer: Medicaid Other | Admitting: Gastroenterology

## 2021-09-29 ENCOUNTER — Encounter: Payer: Self-pay | Admitting: Gastroenterology

## 2021-09-29 DIAGNOSIS — K625 Hemorrhage of anus and rectum: Secondary | ICD-10-CM

## 2021-09-29 MED ORDER — CLENPIQ 10-3.5-12 MG-GM -GM/175ML PO SOLN: 1.0000 | ORAL | 0 refills | Status: AC

## 2021-09-29 NOTE — Progress Notes (Addendum)
Gastroenterology Office Note    Referring Provider: Health, Andre Lefort* Primary Care Physician:  [facility] Primary GI: Dr. Gala Romney   Chief Complaint   Chief Complaint  Patient presents with   Colonoscopy    A little blood in stool about a month ago.     History of Present Illness   Tony Turner is a 31 y.o. male presenting today at the request of Hasanaj, Samul Dada, MD due to history of rectal bleeding.    He notes constant pain in right flank for 4-5 months. Was told there may be something near the colon on the CT by [facility] during office visit, then was called and told it was fine. He was worried about renal stones at that time, as it felt similar to prior episodes. CT not available at time of visit.   Pain gets worse with stress. Doesn't feel like a normal back pain. Not worsened with movement. No constipation or diarrhea. No changes in bowel habits. Heat will help feel better short-term.   Month and a half ago saw blood in stool. No straining at that time. Saw in stool and on tissue. Has had 4-5 episodes of this. No rectal pain, itching, burning.   No weight loss or lack of appetite. No FH colon polyps or colon cancer.     Past Medical History:  Diagnosis Date   History of kidney stones     Past Surgical History:  Procedure Laterality Date   CYSTOSCOPY WITH HOLMIUM LASER LITHOTRIPSY Left 12/11/2015   Procedure: CYSTOSCOPY, ureterscopy, WITH HOLMIUM LASER LITHOTRIPSY and stone removal, left retrograde pyleogram and left stent placement;  Surgeon: Ardis Hughs, MD;  Location: [facility];  Service: Urology;  Laterality: Left;   Hand fracture surgery      Current Outpatient Medications  Medication Sig Dispense Refill   Sod Picosulfate-Mag Ox-Cit Acd (CLENPIQ) 10-3.5-12 MG-GM -GM/175ML SOLN Take 1 kit by mouth as directed. 350 mL 0   No current facility-administered medications for this visit.    Allergies as of 09/29/2021 -  Review Complete 09/29/2021  Allergen Reaction Noted   Penicillins Hives and Nausea And Vomiting 07/12/2012   Amoxicillin Rash 05/14/2011    Family History  Problem Relation Age of Onset   Arthritis Mother    Colon cancer Neg Hx    Colon polyps Neg Hx     Social History   Socioeconomic History   Marital status: Married    Spouse name: Not on file   Number of children: Not on file   Years of education: Not on file   Highest education level: Not on file  Occupational History   Not on file  Tobacco Use   Smoking status: Never   Smokeless tobacco: Never  Substance and Sexual Activity   Alcohol use: Yes    Comment: occasional   Drug use: No   Sexual activity: Not on file  Other Topics Concern   Not on file  Social History Narrative   Not on file   Social Determinants of Health   Financial Resource Strain: Not on file  Food Insecurity: Not on file  Transportation Needs: Not on file  Physical Activity: Not on file  Stress: Not on file  Social Connections: Not on file  Intimate Partner Violence: Not on file     Review of Systems   Gen: Denies any fever, chills, fatigue, weight loss, lack of appetite.  CV: Denies chest pain, heart palpitations, peripheral edema, syncope.  Resp: Denies shortness of breath at rest or with exertion. Denies wheezing or cough.  GI: see HPI GU : Denies urinary burning, urinary frequency, urinary hesitancy MS: Denies joint pain, muscle weakness, cramps, or limitation of movement.  Derm: Denies rash, itching, dry skin Psych: Denies depression, anxiety, memory loss, and confusion Heme: see HPI   Physical Exam   BP (!) 134/90 (BP Location: Right Arm, Patient Position: Sitting, Cuff Size: Normal)   Pulse 90   Temp 97.8 F (36.6 C) (Temporal)   Ht _0  (1.88 m)   Wt 227 lb 3.2 oz (103.1 kg)   SpO2 96%   BMI 29.17 kg/m  General:   Alert and oriented. Pleasant and cooperative. Well-nourished and well-developed.  Head:  Normocephalic  and atraumatic. Eyes:  Without icterus Ears:  Normal auditory acuity. Lungs:  Clear to auscultation bilaterally.  Heart:  S1, S2 present without murmurs appreciated.  Abdomen:  +BS, soft, non-tender and non-distended. No HSM noted. No guarding or rebound. No masses appreciated.  Rectal:  Declined Msk:  Symmetrical without gross deformities. Normal posture. Extremities:  Without edema. Neurologic:  Alert and  oriented x4;  grossly normal neurologically. Skin:  Intact without significant lesions or rashes. Psych:  Alert and cooperative. Normal mood and affect.   Assessment   Tony Turner is a 31 y.o. male presenting today at the request of [facility] due to history of rectal bleeding. He is also reporting right flank pain for the past 4-5 months.   Rectal bleeding: likely benign anorectal source. No changes in bowel habits, constipation, diarrhea, straining. No FH colon cancer or polyps. Will pursue diagnostic colonoscopy in the near future.  Right flank pain: has seen [facility] and ruled out renal stones. I do not have the CT at time of this visit. Symptoms seem to be musculoskeletal; there is no association with bowel habits whatsoever. Recommended continued follow-up with PCP for more dedicated imaging.     PLAN   Proceed with colonoscopy by Dr. Gala Romney in near future: the risks, benefits, and alternatives have been discussed with the patient in detail. The patient states understanding and desires to proceed. ASA 2  Retrieve outside CT reports from [facility]  Follow-up with PCP regarding right flank pain  Annitta Needs, PhD, [facility]

## 2021-09-29 NOTE — H&P (View-Only) (Signed)
  Gastroenterology Office Note    Referring Provider: Health, [place] [facility]* Primary Care Physician:  [facility] Primary GI: Dr. Rourk   Chief Complaint   Chief Complaint  Patient presents with   Colonoscopy    A little blood in stool about a month ago.     History of Present Illness   Tony Turner is a 31 y.o. male presenting today at the request of Hasanaj, Xaje A, MD due to history of rectal bleeding.    He notes constant pain in right flank for 4-5 months. Was told there may be something near the colon on the CT by [facility] during office visit, then was called and told it was fine. He was worried about renal stones at that time, as it felt similar to prior episodes. CT not available at time of visit.   Pain gets worse with stress. Doesn't feel like a normal back pain. Not worsened with movement. No constipation or diarrhea. No changes in bowel habits. Heat will help feel better short-term.   Month and a half ago saw blood in stool. No straining at that time. Saw in stool and on tissue. Has had 4-5 episodes of this. No rectal pain, itching, burning.   No weight loss or lack of appetite. No FH colon polyps or colon cancer.     Past Medical History:  Diagnosis Date   History of kidney stones     Past Surgical History:  Procedure Laterality Date   CYSTOSCOPY WITH HOLMIUM LASER LITHOTRIPSY Left 12/11/2015   Procedure: CYSTOSCOPY, ureterscopy, WITH HOLMIUM LASER LITHOTRIPSY and stone removal, left retrograde pyleogram and left stent placement;  Surgeon: Benjamin W Herrick, MD;  Location: [facility];  Service: Urology;  Laterality: Left;   Hand fracture surgery      Current Outpatient Medications  Medication Sig Dispense Refill   Sod Picosulfate-Mag Ox-Cit Acd (CLENPIQ) 10-3.5-12 MG-GM -GM/175ML SOLN Take 1 kit by mouth as directed. 350 mL 0   No current facility-administered medications for this visit.    Allergies as of 09/29/2021 -  Review Complete 09/29/2021  Allergen Reaction Noted   Penicillins Hives and Nausea And Vomiting 07/12/2012   Amoxicillin Rash 05/14/2011    Family History  Problem Relation Age of Onset   Arthritis Mother    Colon cancer Neg Hx    Colon polyps Neg Hx     Social History   Socioeconomic History   Marital status: Married    Spouse name: Not on file   Number of children: Not on file   Years of education: Not on file   Highest education level: Not on file  Occupational History   Not on file  Tobacco Use   Smoking status: Never   Smokeless tobacco: Never  Substance and Sexual Activity   Alcohol use: Yes    Comment: occasional   Drug use: No   Sexual activity: Not on file  Other Topics Concern   Not on file  Social History Narrative   Not on file   Social Determinants of Health   Financial Resource Strain: Not on file  Food Insecurity: Not on file  Transportation Needs: Not on file  Physical Activity: Not on file  Stress: Not on file  Social Connections: Not on file  Intimate Partner Violence: Not on file     Review of Systems   Gen: Denies any fever, chills, fatigue, weight loss, lack of appetite.  CV: Denies chest pain, heart palpitations, peripheral edema, syncope.    Resp: Denies shortness of breath at rest or with exertion. Denies wheezing or cough.  GI: see HPI GU : Denies urinary burning, urinary frequency, urinary hesitancy MS: Denies joint pain, muscle weakness, cramps, or limitation of movement.  Derm: Denies rash, itching, dry skin Psych: Denies depression, anxiety, memory loss, and confusion Heme: see HPI   Physical Exam   BP (!) 134/90 (BP Location: Right Arm, Patient Position: Sitting, Cuff Size: Normal)   Pulse 90   Temp 97.8 F (36.6 C) (Temporal)   Ht 6' 2" (1.88 m)   Wt 227 lb 3.2 oz (103.1 kg)   SpO2 96%   BMI 29.17 kg/m  General:   Alert and oriented. Pleasant and cooperative. Well-nourished and well-developed.  Head:  Normocephalic  and atraumatic. Eyes:  Without icterus Ears:  Normal auditory acuity. Lungs:  Clear to auscultation bilaterally.  Heart:  S1, S2 present without murmurs appreciated.  Abdomen:  +BS, soft, non-tender and non-distended. No HSM noted. No guarding or rebound. No masses appreciated.  Rectal:  Declined Msk:  Symmetrical without gross deformities. Normal posture. Extremities:  Without edema. Neurologic:  Alert and  oriented x4;  grossly normal neurologically. Skin:  Intact without significant lesions or rashes. Psych:  Alert and cooperative. Normal mood and affect.   Assessment   Tony Turner is a 31 y.o. male presenting today at the request of [facility] due to history of rectal bleeding. He is also reporting right flank pain for the past 4-5 months.   Rectal bleeding: likely benign anorectal source. No changes in bowel habits, constipation, diarrhea, straining. No FH colon cancer or polyps. Will pursue diagnostic colonoscopy in the near future.  Right flank pain: has seen [facility] and ruled out renal stones. I do not have the CT at time of this visit. Symptoms seem to be musculoskeletal; there is no association with bowel habits whatsoever. Recommended continued follow-up with PCP for more dedicated imaging.     PLAN   Proceed with colonoscopy by Dr. Rourk in near future: the risks, benefits, and alternatives have been discussed with the patient in detail. The patient states understanding and desires to proceed. ASA 2  Retrieve outside CT reports from [facility]  Follow-up with PCP regarding right flank pain  Alizabeth Antonio W. Gennavieve Huq, PhD, ANP-BC [facility]   

## 2021-09-29 NOTE — Patient Instructions (Signed)
We are arranging a colonoscopy in the near future!  Further recommendations to follow!  It was a pleasure to see you today. I want to create trusting relationships with patients to provide genuine, compassionate, and quality care. I value your feedback. If you receive a survey regarding your visit,  I greatly appreciate you taking time to fill this out.   Leinaala Catanese W. Cinnamon Morency, PhD, ANP-BC [facility]   

## 2021-09-29 NOTE — Telephone Encounter (Signed)
Darl Pikes:  [facility] sent a CT from Feb 2023; however, patient had one more recently (Maybe July??)please see if they can fax that ! Thanks!

## 2021-10-26 ENCOUNTER — Encounter (HOSPITAL_COMMUNITY)
Admission: RE | Disposition: A | Discharge status: Home / Self Care | Payer: Self-pay | Source: Ambulatory Visit | Attending: Internal Medicine

## 2021-10-26 ENCOUNTER — Encounter (HOSPITAL_COMMUNITY): Payer: Self-pay | Admitting: Internal Medicine

## 2021-10-26 ENCOUNTER — Ambulatory Visit (HOSPITAL_BASED_OUTPATIENT_CLINIC_OR_DEPARTMENT_OTHER): Payer: Medicaid Other | Admitting: Anesthesiology

## 2021-10-26 ENCOUNTER — Other Ambulatory Visit: Payer: Self-pay

## 2021-10-26 ENCOUNTER — Ambulatory Visit (HOSPITAL_COMMUNITY)
Admission: RE | Admit: 2021-10-26 | Discharge: 2021-10-26 | Disposition: A | Discharge status: Home / Self Care | Payer: Medicaid Other | Source: Ambulatory Visit | Attending: Internal Medicine | Admitting: Internal Medicine

## 2021-10-26 ENCOUNTER — Ambulatory Visit (HOSPITAL_COMMUNITY): Payer: Medicaid Other | Admitting: Anesthesiology

## 2021-10-26 DIAGNOSIS — K921 Melena: Secondary | ICD-10-CM | POA: Diagnosis present

## 2021-10-26 DIAGNOSIS — K640 First degree hemorrhoids: Secondary | ICD-10-CM | POA: Diagnosis not present

## 2021-10-26 HISTORY — PX: COLONOSCOPY WITH PROPOFOL: SHX5780

## 2021-10-26 SURGERY — COLONOSCOPY WITH PROPOFOL
Anesthesia: General

## 2021-10-26 MED ORDER — STERILE WATER FOR IRRIGATION IR SOLN
Status: DC | PRN
  Administered 2021-10-26: 120 mL

## 2021-10-26 MED ORDER — LACTATED RINGERS IV SOLN: INTRAVENOUS | Status: DC | PRN

## 2021-10-26 MED ORDER — PROPOFOL 500 MG/50ML IV EMUL
INTRAVENOUS | Status: AC
  Filled 2021-10-26: qty 100

## 2021-10-26 MED ORDER — PROPOFOL 1000 MG/100ML IV EMUL
INTRAVENOUS | Status: AC
  Filled 2021-10-26: qty 200

## 2021-10-26 MED ORDER — LIDOCAINE HCL (PF) 2 % IJ SOLN
INTRAMUSCULAR | Status: AC
  Filled 2021-10-26: qty 20

## 2021-10-26 MED ORDER — LIDOCAINE HCL (CARDIAC) PF 100 MG/5ML IV SOSY
PREFILLED_SYRINGE | INTRAVENOUS | Status: DC | PRN
  Administered 2021-10-26: 60 mg via INTRATRACHEAL

## 2021-10-26 MED ORDER — LACTATED RINGERS IV SOLN: INTRAVENOUS | Status: DC

## 2021-10-26 MED ORDER — PROPOFOL 10 MG/ML IV BOLUS
INTRAVENOUS | Status: DC | PRN
  Administered 2021-10-26: 50 mg via INTRAVENOUS
  Administered 2021-10-26: 200 mg via INTRAVENOUS

## 2021-10-26 MED ORDER — PROPOFOL 500 MG/50ML IV EMUL
INTRAVENOUS | Status: DC | PRN
  Administered 2021-10-26: 250 ug/kg/min via INTRAVENOUS

## 2021-10-26 NOTE — Op Note (Signed)
[facility] Patient Name: Tony Turner Procedure Date: 10/26/2021 10:30 AM MRN: [id number] Date of Birth: [birth year] Attending MD: Norvel Richards , MD CSN: [id number] Age: 31 Admit Type: Outpatient Procedure:                Colonoscopy Indications:              Hematochezia Providers:                Norvel Richards, MD, Lambert Mody, Ladoris Gene Technician, Technician, Aram Candela Referring MD:              Medicines:                Propofol per Anesthesia Complications:            No immediate complications. Estimated Blood Loss:     Estimated blood loss: none. Procedure:                Pre-Anesthesia Assessment:                           - Prior to the procedure, a History and Physical                            was performed, and patient medications and                            allergies were reviewed. The patient's tolerance of                            previous anesthesia was also reviewed. The risks                            and benefits of the procedure and the sedation                            options and risks were discussed with the patient.                            All questions were answered, and informed consent                            was obtained. Prior Anticoagulants: The patient has                            taken no previous anticoagulant or antiplatelet                            agents. ASA Grade Assessment: II - A patient with                            mild systemic disease. After reviewing the risks  and benefits, the patient was deemed in                            satisfactory condition to undergo the procedure.                           After obtaining informed consent, the colonoscope                            was passed under direct vision. Throughout the                            procedure, the patient's blood pressure, pulse, and                            oxygen  saturations were monitored continuously. The                            [id number] scope was introduced through the                            anus and advanced to the the cecum, identified by                            appendiceal orifice and ileocecal valve. The                            colonoscopy was performed without difficulty. The                            patient tolerated the procedure well. The quality                            of the bowel preparation was adequate. Scope In: 10:50:14 AM Scope Out: 11:02:25 AM Scope Withdrawal Time: 0 hours 8 minutes 12 seconds  Total Procedure Duration: 0 hours 12 minutes 11 seconds  Findings:      The perianal and digital rectal examinations were normal.      Non-bleeding internal hemorrhoids were found during retroflexion. The       hemorrhoids were mild, small, medium-sized and Grade I (internal       hemorrhoids that do not prolapse).      The exam was otherwise without abnormality on direct and retroflexion       views. Distal 15 cm of TI appeared normal. Impression:               - Non-bleeding internal hemorrhoids.                           - The examination was otherwise normal on direct                            and retroflexion views. Normal-appearing terminal                            ileum.                           -  No specimens collected. I suspect trivial                            bleeding from the anorectum (hemorrhoids).                            Back/flank pain not likely emanating from his GI                            tract. Moderate Sedation:      Moderate (conscious) sedation was personally administered by an       anesthesia professional. The following parameters were monitored: oxygen       saturation, heart rate, blood pressure, and response to care. Recommendation:           - Patient has a contact number available for                            emergencies. The signs and symptoms of potential                             delayed complications were discussed with the                            patient. Return to normal activities tomorrow.                            Written discharge instructions were provided to the                            patient.                           - Resume previous diet.                           - Continue present medications.                           - Repeat colonoscopy at age 31 for screening                            purposes. Begin daily fiber supplement in the way                            of Benefiber. Office visit with us in 3 months. Procedure Code(s):        --- Professional ---                           [id number], Colonoscopy, flexible; diagnostic, including                            collection of specimen(s) by brushing or washing,                            when performed (separate procedure)  Diagnosis Code(s):        --- Professional ---                           K64.0, First degree hemorrhoids                           K92.1, Melena (includes Hematochezia) CPT copyright 2019 [employer]. All rights reserved. The codes documented in this report are preliminary and upon coder review may  be revised to meet current compliance requirements. Gerrit Friends. Miquel Lamson, MD Gennette Pac, MD 10/26/2021 11:13:23 AM This report has been signed electronically. Number of Addenda: 0

## 2021-10-26 NOTE — Anesthesia Preprocedure Evaluation (Signed)
Anesthesia Evaluation  Patient identified by MRN, date of birth, ID band Patient awake    Reviewed: Allergy & Precautions, H&P , NPO status , Patient's Chart, lab work & pertinent test results, reviewed documented beta blocker date and time   Airway Mallampati: II  TM Distance: >3 FB Neck ROM: full    Dental no notable dental hx.    Pulmonary neg pulmonary ROS,    Pulmonary exam normal breath sounds clear to auscultation       Cardiovascular Exercise Tolerance: Good negative cardio ROS   Rhythm:regular Rate:Normal     Neuro/Psych negative neurological ROS  negative psych ROS   GI/Hepatic negative GI ROS, Neg liver ROS,   Endo/Other  negative endocrine ROS  Renal/GU negative Renal ROS  negative genitourinary   Musculoskeletal   Abdominal   Peds  Hematology negative hematology ROS (+)   Anesthesia Other Findings   Reproductive/Obstetrics negative OB ROS                             Anesthesia Physical Anesthesia Plan  ASA: 1  Anesthesia Plan: General   Post-op Pain Management:    Induction:   PONV Risk Score and Plan: Propofol infusion  Airway Management Planned:   Additional Equipment:   Intra-op Plan:   Post-operative Plan:   Informed Consent: I have reviewed the patients History and Physical, chart, labs and discussed the procedure including the risks, benefits and alternatives for the proposed anesthesia with the patient or authorized representative who has indicated his/her understanding and acceptance.     Dental Advisory Given  Plan Discussed with: CRNA  Anesthesia Plan Comments:         Anesthesia Quick Evaluation  

## 2021-10-26 NOTE — Interval H&P Note (Signed)
History and Physical Interval Note:  10/26/2021 10:40 AM  Tony Turner  has presented today for surgery, with the diagnosis of rectal bleeding.  The various methods of treatment have been discussed with the patient and family. After consideration of risks, benefits and other options for treatment, the patient has consented to  Procedure(s) with comments: COLONOSCOPY WITH PROPOFOL (N/A) - 11:00 am as a surgical intervention.  The patient's history has been reviewed, patient examined, no change in status, stable for surgery.  I have reviewed the patient's chart and labs.  Questions were answered to the patient's satisfaction.     Tony Turner  Only a couple episodes of rectal bleeding (on paper) past couple of months.  Most recent CT report never arrived for review.  Diagnostic colonoscopy today per plan.  Have offered the patient a diagnostic colonoscopy.  The risks, benefits, limitations, alternatives and imponderables have been reviewed with the patient. Questions have been answered. All parties are agreeable.

## 2021-10-26 NOTE — Transfer of Care (Signed)
Immediate Anesthesia Transfer of Care Note  Patient: Tony Turner  Procedure(s) Performed: COLONOSCOPY WITH PROPOFOL  Patient Location: Short Stay  Anesthesia Type:General  Level of Consciousness: awake, alert  and oriented  Airway & Oxygen Therapy: Patient Spontanous Breathing  Post-op Assessment: Report given to RN and Post -op Vital signs reviewed and stable  Post vital signs: Reviewed and stable  Last Vitals:  Vitals Value Taken Time  BP 120/80   Temp 36   Pulse 80   Resp 16   SpO2 96     Last Pain:  Vitals:   10/26/21 1047  TempSrc:   PainSc: 0-No pain         Complications: No notable events documented.

## 2021-10-26 NOTE — Discharge Instructions (Addendum)
  Colonoscopy Discharge Instructions  Read the instructions outlined below and refer to this sheet in the next few weeks. These discharge instructions provide you with general information on caring for yourself after you leave the hospital. Your doctor may also give you specific instructions. While your treatment has been planned according to the most current medical practices available, unavoidable complications occasionally occur. If you have any problems or questions after discharge, call Dr. Gala Romney at [phone]. ACTIVITY You may resume your regular activity, but move at a slower pace for the next 24 hours.  Take frequent rest periods for the next 24 hours.  Walking will help get rid of the air and reduce the bloated feeling in your belly (abdomen).  No driving for 24 hours (because of the medicine (anesthesia) used during the test).   Do not sign any important legal documents or operate any machinery for 24 hours (because of the anesthesia used during the test).  NUTRITION Drink plenty of fluids.  You may resume your normal diet as instructed by your doctor.  Begin with a light meal and progress to your normal diet. Heavy or fried foods are harder to digest and may make you feel sick to your stomach (nauseated).  Avoid alcoholic beverages for 24 hours or as instructed.  MEDICATIONS You may resume your normal medications unless your doctor tells you otherwise.  WHAT YOU CAN EXPECT TODAY Some feelings of bloating in the abdomen.  Passage of more gas than usual.  Spotting of blood in your stool or on the toilet paper.  IF YOU HAD POLYPS REMOVED DURING THE COLONOSCOPY: No aspirin products for 7 days or as instructed.  No alcohol for 7 days or as instructed.  Eat a soft diet for the next 24 hours.  FINDING OUT THE RESULTS OF YOUR TEST Not all test results are available during your visit. If your test results are not back during the visit, make an appointment with your caregiver to find out the  results. Do not assume everything is normal if you have not heard from your caregiver or the medical facility. It is important for you to follow up on all of your test results.  SEEK IMMEDIATE MEDICAL ATTENTION IF: You have more than a spotting of blood in your stool.  Your belly is swollen (abdominal distention).  You are nauseated or vomiting.  You have a temperature over 101.  You have abdominal pain or discomfort that is severe or gets worse throughout the day.    Your colon looked good today.  You had minimal internal hemorrhoids-likely the source of bleeding  Hemorrhoid information provided  It is recommended you return for average risk screening colonoscopy beginning at age 66  Begin a daily fiber supplement as follows:  Begin Benefiber 1 tablespoon daily for 3 weeks; then increase to 2 tablespoons daily thereafter.  Take every day like a "One-A-Day vitamin"  Office visit with Roseanne Kaufman in 12 weeks  At patient request, I called Jaking Thayer at [phone] reviewed findings.  DR. Roseanne Kaufman OFFICE WILL CALL WITH APPOINTMENT INFORMATION - FOLLOW UP WITH THE OFFICE IF YOU DO NOT HEAR FROM THEM THIS WEEK - [phone]

## 2021-10-26 NOTE — Anesthesia Postprocedure Evaluation (Signed)
Anesthesia Post Note  Patient: Tony Turner  Procedure(s) Performed: COLONOSCOPY WITH PROPOFOL  Patient location during evaluation: Phase II Anesthesia Type: General Level of consciousness: awake Pain management: pain level controlled Vital Signs Assessment: post-procedure vital signs reviewed and stable Respiratory status: spontaneous breathing and respiratory function stable Cardiovascular status: blood pressure returned to baseline and stable Postop Assessment: no headache and no apparent nausea or vomiting Anesthetic complications: no Comments: Late entry   No notable events documented.   Last Vitals:  Vitals:   10/26/21 0945 10/26/21 1104  BP: (!) 143/96 110/61  Pulse: 99 97  Resp: 16 14  Temp: 37 C 37 C  SpO2: 100% 97%    Last Pain:  Vitals:   10/26/21 1104  TempSrc: Oral  PainSc: 0-No pain                 Louann Sjogren

## 2021-11-02 ENCOUNTER — Encounter (HOSPITAL_COMMUNITY): Payer: Self-pay | Admitting: Internal Medicine

## 2021-11-04 NOTE — Telephone Encounter (Signed)
I sent another request

## 2021-11-13 NOTE — Progress Notes (Signed)
CT dated Feb 2023. Nonobstructive right renal stone. Small fat containing left inguinal hernia.

## 2022-01-19 ENCOUNTER — Ambulatory Visit: Payer: Medicaid Other | Admitting: Gastroenterology

## 2022-01-19 NOTE — Progress Notes (Deleted)
Colonoscopy Sept 2023: non-bleeding internal hemorrhoids. Normal TI.

## 2022-01-20 ENCOUNTER — Ambulatory Visit: Payer: Medicaid Other | Admitting: Gastroenterology

## 2022-02-22 ENCOUNTER — Encounter: Payer: Self-pay | Admitting: Internal Medicine
